# Patient Record
Sex: Female | Born: 1975 | Race: White | Hispanic: No | Marital: Married
Health system: Midwestern US, Academic
[De-identification: ages and names within clinical notes are randomized; demographics above are authoritative.]

---

## 2021-01-28 ENCOUNTER — Encounter: Admit: 2021-01-28 | Discharge: 2021-01-28

## 2021-01-28 DIAGNOSIS — M549 Dorsalgia, unspecified: Secondary | ICD-10-CM

## 2021-01-28 LAB — ALCOHOL LEVEL: ALCOHOL: <10 mg/dL

## 2021-01-28 LAB — COMPREHENSIVE METABOLIC PANEL
ALBUMIN: 3.1 g/dL — ABNORMAL LOW (ref 3.5–5.0)
ALK PHOSPHATASE: 104 U/L — ABNORMAL LOW (ref 25–110)
ALT: 15 U/L (ref 7–56)
AST: 14 U/L (ref 7–40)
BLD UREA NITROGEN: 8 mg/dL (ref 7–25)
CALCIUM: 7.9 mg/dL — ABNORMAL LOW (ref 8.5–10.6)
CHLORIDE: 100 MMOL/L — ABNORMAL LOW (ref 98–110)
CO2: 27 MMOL/L (ref 21–30)
CREATININE: 0.4 mg/dL (ref 0.4–1.00)
GLUCOSE,PANEL: 113 mg/dL — ABNORMAL HIGH (ref 70–100)
POTASSIUM: 3.3 MMOL/L — ABNORMAL LOW (ref 3.5–5.1)
SODIUM: 139 MMOL/L — ABNORMAL LOW (ref 137–147)
TOTAL BILIRUBIN: 0.3 mg/dL (ref 0.3–1.2)
TOTAL PROTEIN: 5.6 g/dL — ABNORMAL LOW (ref 6.0–8.0)

## 2021-01-28 LAB — CBC AND DIFF: WBC COUNT: 15 K/UL — ABNORMAL HIGH (ref 4.5–11.0)

## 2021-01-28 MED ORDER — OXYCODONE 5 MG PO TAB
5 mg | ORAL | 0 refills | Status: AC | PRN
Start: 2021-01-28 — End: ?
  Administered 2021-01-29 – 2021-02-01 (×11): 5 mg via ORAL

## 2021-01-28 MED ORDER — SENNOSIDES-DOCUSATE SODIUM 8.6-50 MG PO TAB
1 | Freq: Every day | ORAL | 0 refills | Status: AC | PRN
Start: 2021-01-28 — End: ?
  Administered 2021-01-30 – 2021-02-10 (×8): 1 via ORAL

## 2021-01-28 MED ORDER — FENTANYL CITRATE (PF) 50 MCG/ML IJ SOLN
25-50 ug | INTRAVENOUS | 0 refills | Status: AC | PRN
Start: 2021-01-28 — End: ?
  Administered 2021-01-29 – 2021-02-02 (×11): 50 ug via INTRAVENOUS

## 2021-01-28 MED ORDER — ACETAMINOPHEN 325 MG PO TAB
650 mg | ORAL | 0 refills | Status: AC | PRN
Start: 2021-01-28 — End: ?
  Administered 2021-01-31 – 2021-02-01 (×3): 650 mg via ORAL

## 2021-01-28 MED ORDER — POLYETHYLENE GLYCOL 3350 17 GRAM PO PWPK
1 | Freq: Every day | ORAL | 0 refills | Status: AC | PRN
Start: 2021-01-28 — End: ?

## 2021-01-28 MED ORDER — LORAZEPAM 1 MG PO TAB
2 mg | ORAL | 0 refills | Status: AC | PRN
Start: 2021-01-28 — End: ?

## 2021-01-28 MED ORDER — ONDANSETRON 4 MG PO TBDI
4 mg | ORAL | 0 refills | Status: AC | PRN
Start: 2021-01-28 — End: ?
  Administered 2021-02-10: 02:00:00 4 mg via ORAL

## 2021-01-28 MED ORDER — LORAZEPAM 1 MG PO TAB
1 mg | ORAL | 0 refills | Status: AC | PRN
Start: 2021-01-28 — End: ?

## 2021-01-28 MED ORDER — VANCOMYCIN (15-40KG) 250ML IVPB
15 mg/kg | INTRAVENOUS | 0 refills | Status: DC
Start: 2021-01-28 — End: 2021-01-29

## 2021-01-28 MED ORDER — VANCOMYCIN PHARMACY TO MANAGE
1 | 0 refills | Status: DC
Start: 2021-01-28 — End: 2021-01-29

## 2021-01-28 MED ORDER — ONDANSETRON HCL (PF) 4 MG/2 ML IJ SOLN
4 mg | INTRAVENOUS | 0 refills | Status: AC | PRN
Start: 2021-01-28 — End: ?
  Administered 2021-02-09 (×3): 4 mg via INTRAVENOUS

## 2021-01-28 MED ORDER — MELATONIN 5 MG PO TAB
10 mg | Freq: Every evening | ORAL | 0 refills | Status: AC
Start: 2021-01-28 — End: ?
  Administered 2021-01-29 – 2021-02-10 (×13): 10 mg via ORAL

## 2021-01-28 MED ORDER — LORAZEPAM 2 MG/ML IJ SOLN
1 mg | INTRAVENOUS | 0 refills | Status: AC | PRN
Start: 2021-01-28 — End: ?
  Administered 2021-01-29: 06:00:00 1 mg via INTRAVENOUS

## 2021-01-28 MED ORDER — MELATONIN 5 MG PO TAB
5 mg | Freq: Every evening | ORAL | 0 refills | Status: AC | PRN
Start: 2021-01-28 — End: ?

## 2021-01-28 MED ORDER — VANCOMYCIN 1250MG IN 0.9% NACL IVPB (BATCHED)
20 mg/kg | Freq: Once | INTRAVENOUS | 0 refills | Status: AC
Start: 2021-01-28 — End: ?
  Administered 2021-01-29: 06:00:00 1250 mg via INTRAVENOUS

## 2021-01-28 MED ORDER — VANCOMYCIN PHARMACY TO MANAGE
1 | 0 refills | Status: AC
Start: 2021-01-28 — End: ?

## 2021-01-28 MED ORDER — HEPARIN, PORCINE (PF) 5,000 UNIT/0.5 ML IJ SYRG
5000 [IU] | SUBCUTANEOUS | 0 refills | Status: AC
Start: 2021-01-28 — End: ?
  Administered 2021-01-29 – 2021-01-30 (×4): 5000 [IU] via SUBCUTANEOUS

## 2021-01-28 MED ORDER — LORAZEPAM 2 MG/ML IJ SOLN
2 mg | INTRAVENOUS | 0 refills | Status: AC | PRN
Start: 2021-01-28 — End: ?

## 2021-01-28 NOTE — Progress Notes
45 yo female pmhx of current smoker, hypothyroidism, back pain.  Pt has been seen recently working up lower back pain present for about 1 month. Had been given pain meds, muscle relaxers, ibuprofen   No trauma and no recent surgical intervention. No neurological deficits but left sided radiculopathy   On imaging Pt has L2-L3 discitis, osteomyelitis, with abscess calling it an epidural abscess    143/78  119  100% on RA  temp 100    WBC 14.8 with 71.9 neutrophils  Hgb 12.5  Hct 36.7  Plt 487  Lactate 2.0  K 3.2  Alk Phos 138    Blood cultures drawn x 2  Treatment:  IV pain meds Fentanyl and Dilaudid, IV fluids  Requesting recommendations on Abx

## 2021-01-29 ENCOUNTER — Inpatient Hospital Stay: Admit: 2021-01-29

## 2021-01-29 ENCOUNTER — Encounter: Admit: 2021-01-29 | Discharge: 2021-01-29 | Payer: BC Managed Care – PPO

## 2021-01-29 ENCOUNTER — Inpatient Hospital Stay: Admit: 2021-01-29 | Discharge: 2021-01-29 | Payer: BC Managed Care – PPO

## 2021-01-29 DIAGNOSIS — I10 Essential (primary) hypertension: Secondary | ICD-10-CM

## 2021-01-29 DIAGNOSIS — E039 Hypothyroidism, unspecified: Secondary | ICD-10-CM

## 2021-01-29 DIAGNOSIS — E785 Hyperlipidemia, unspecified: Secondary | ICD-10-CM

## 2021-01-29 MED ADMIN — POTASSIUM CHLORIDE 20 MEQ PO TBTQ [35943]: 60 meq | ORAL | @ 14:00:00 | Stop: 2021-01-29 | NDC 00245531989

## 2021-01-29 MED ADMIN — VANCOMYCIN 0.75G IN 0.9% NACL IVPB (BATCHED) [213736]: 750 mg | INTRAVENOUS | @ 17:00:00 | NDC 54029433209

## 2021-01-29 NOTE — Consults
Neurosurgery Consult History and Physical Examination    Kim Matthews  Admission Date:  01/28/2021                       Assessment/Plan:  Kim Matthews is a 45 y.o. 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    - No acute neurosurgical needs  - Agree with blood cultures x2 for bacterial isolation.  If negative, recommend repeat blood cultures.  - If blood cultures are negative, then recommend consulting IR for a x-ray guided needle biopsy of the area of concern at L1-2.  - The MRI performed does not demonstrate any surgical concerns, however it is a non-contrasted study.  For completeness in infectious workup, recommend an MRI L-spine with/without contrast.  Please re-engage neurosurgery when this scan is completed for review.  - Patient and plan discussed with neurosurgery attending and senior resident  - Please call with any changes in neurologic exam, questions, or concerns.    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  The patient provides the history.  She states that she has had lower back pain for the past month.  There is associated left leg pain with no radiculopathy, however there is localized left anterior thigh pain.  Her pain has been refractory to pain medications such as ibuprofen or muscle relaxants.  She denies any history of any recent trauma.  She denies any concerns for urinary or bowel incontinence.   At the OSH, an MRI L spine without contrast was done that demonstrated concerns for minimal L1-2 osteodiscitis with NO concerns for any epidural abscess or structural deformity.  There were signs of a psoas fluid collection potentially concerning for a psoas abscess.  The patient denies any history of drug use, however she is noted to have several pock marks throughout her extremities.  Blood cultures were drawn.  Temp 37.7.  WBC 14.8.     Past Medical History:  No pertinent past medical history    Past Surgical History:  No pertinent past surgical history    Social History:  Social History     Tobacco Use   ? Smoking status: Yes   ? Smokeless tobacco: Not on file   Substance Use Topics   ? Alcohol use: No   ? Drug use: Denies but highly suspicious       Family History:  No pertinent family medical history    Allergies:    Penicillins and Wellbutrin [bupropion hcl]    Medications:  No medications prior to admission.       Review of Systems:   Full 10 point review of systems negative except for HPI    Physical Exam:  Vital Signs: Last Filed In 24 Hours Vital Signs: 24 Hour Range   BP: 118/79 (05/06 2148)  Temp: 37.7 ?C (99.8 ?F) (05/06 2148)  Pulse: 126 (05/06 2148)  Respirations: 18 PER MINUTE (05/06 2148)  SpO2: 94 % (05/06 2148)  O2 Device: None (Room air) (05/06 2148) BP: (118)/(79)   Temp:  [37.7 ?C (99.8 ?F)]   Pulse:  [78-126]   Respirations:  [18 PER MINUTE]   SpO2:  [94 %-98 %]  O2 Device: None (Room air)   Intensity Pain Scale (Self Report): 4 (01/28/21 2100)      General appearance: No acute distress  Lungs: Clear to auscultation bilaterally  Heart: Regular rate and rhythm  Gastrointestinal: Soft, non-tender. Bowel sounds normal. no masses,  no organomegaly  Musculoskeletal: No edema, redness or tenderness in the calves or thighs  Skin: Pock marks throughout all extremities  Psychiatric: Normal affect    Neurologic Exam:   Mental Status: Awake, alert and oriented x 4, fluent speech, normal cognition  Pupils: Pupils equal round and reactive to light  Cranial Nerves: CN II-XII individually tested and found to be intact, gag not tested  Motor:  She has good range of motion and has some limited movements to resistance in the left hip flexion secondary to pain, otherwise she is full strength throughout.  Normal muscle bulk and tone  Sensation: Sensation intact to light touch throughout  Deep Tendon Reflexes:   Tr Bi Br Pa Ac   Left 2 2 2 2 2    Right 2 2 2 2 2    Plantar responses toes downgoing bilaterally  No clonus bilaterally  Rectal: Not assessed    Lab Tests:  Hematology:  No results found for: HGB, HCT, PLTCT, WBC, NEUT, ANC, LYMPH, ALC, ABSLYMPHCT, MONA, AMC, ABC, BASOPHILS, MCV, MCHC, MPV, RDW  General Chemistry: No results found for: NA, K, CL, CO2, BUN, CR, GLU, OBSCA, CA, MG, PO4, FREEPHENY  General Chemistry: No results found for: GAP, KETONES, ALBUMIN, LACTIC, TOTBILI, DBILI, BILIIND, TOTBILCB, TOTPROT, LIPASE, AMY, AST, ALT, ALKPHOS, LDH    Radiology and other Diagnostics Review:    Pertinent imaging reviewed   MRI L spine without contrast demonstrates concerns for minimal L1-2 osteodiscitis with NO concerns for any epidural abscess or loss of height.  There were signs of a psoas fluid collection potentially concerning for a psoas abscess.    Marlynn Perking, MD  [phone]

## 2021-01-30 ENCOUNTER — Encounter: Admit: 2021-01-30 | Discharge: 2021-01-30 | Payer: BC Managed Care – PPO

## 2021-01-30 ENCOUNTER — Inpatient Hospital Stay: Admit: 2021-01-30 | Discharge: 2021-01-30 | Payer: BC Managed Care – PPO

## 2021-01-30 MED ADMIN — CEFTRIAXONE INJ 2GM IVP [210254]: 2 g | INTRAVENOUS | @ 01:00:00 | NDC 00781320990

## 2021-01-30 MED ADMIN — HEPARIN, PORCINE (PF) 5,000 UNIT/0.5 ML IJ SYRG [95535]: 5000 [IU] | SUBCUTANEOUS | @ 19:00:00 | Stop: 2021-01-30 | NDC 00409131611

## 2021-01-30 MED ADMIN — VANCOMYCIN 0.75G IN 0.9% NACL IVPB (BATCHED) [213736]: 750 mg | INTRAVENOUS | @ 18:00:00 | NDC 54029433209

## 2021-01-30 MED ADMIN — VANCOMYCIN 0.75G IN 0.9% NACL IVPB (BATCHED) [213736]: 750 mg | INTRAVENOUS | @ 06:00:00 | NDC 54029433209

## 2021-01-31 ENCOUNTER — Encounter: Admit: 2021-01-31 | Discharge: 2021-01-31 | Payer: BC Managed Care – PPO

## 2021-01-31 ENCOUNTER — Inpatient Hospital Stay: Admit: 2021-01-31 | Discharge: 2021-01-31 | Payer: BC Managed Care – PPO

## 2021-01-31 DIAGNOSIS — E785 Hyperlipidemia, unspecified: Secondary | ICD-10-CM

## 2021-01-31 DIAGNOSIS — E039 Hypothyroidism, unspecified: Secondary | ICD-10-CM

## 2021-01-31 DIAGNOSIS — I10 Essential (primary) hypertension: Secondary | ICD-10-CM

## 2021-01-31 MED ADMIN — MIDAZOLAM 1 MG/ML IJ SOLN [10607]: 1 mg | INTRAVENOUS | @ 18:00:00 | Stop: 2021-01-31 | NDC 00409230516

## 2021-01-31 MED ADMIN — FENTANYL CITRATE (PF) 50 MCG/ML IJ SOLN [3037]: 25 ug | INTRAVENOUS | @ 18:00:00 | Stop: 2021-01-31 | NDC 00409909425

## 2021-01-31 MED ADMIN — SODIUM CHLORIDE 0.9 % IJ SOLN [7319]: 10 mL | INTRAVENOUS | @ 07:00:00 | Stop: 2021-01-31 | NDC 00409488820

## 2021-01-31 MED ADMIN — GADOBENATE DIMEGLUMINE 529 MG/ML (0.1MMOL/0.2ML) IV SOLN [135881]: 13 mL | INTRAVENOUS | @ 07:00:00 | Stop: 2021-01-31 | NDC 00270516414

## 2021-01-31 MED ADMIN — FENTANYL CITRATE (PF) 50 MCG/ML IJ SOLN [3037]: 50 ug | INTRAVENOUS | @ 18:00:00 | Stop: 2021-01-31 | NDC 00409909425

## 2021-01-31 MED ADMIN — CEFTRIAXONE INJ 2GM IVP [210254]: 2 g | INTRAVENOUS | @ 02:00:00 | NDC 00781320990

## 2021-01-31 MED ADMIN — VANCOMYCIN 0.75G IN 0.9% NACL IVPB (BATCHED) [213736]: 750 mg | INTRAVENOUS | @ 21:00:00 | NDC 54029433209

## 2021-01-31 MED ADMIN — VANCOMYCIN 0.75G IN 0.9% NACL IVPB (BATCHED) [213736]: 750 mg | INTRAVENOUS | @ 05:00:00 | NDC 54029433209

## 2021-02-01 ENCOUNTER — Inpatient Hospital Stay: Admit: 2021-02-01 | Discharge: 2021-02-01 | Payer: BC Managed Care – PPO

## 2021-02-01 MED ADMIN — OXYCODONE 5 MG PO TAB [10814]: 10 mg | ORAL | @ 23:00:00 | NDC 00904696661

## 2021-02-01 MED ADMIN — POLYETHYLENE GLYCOL 3350 17 GRAM PO PWPK [25424]: 17 g | ORAL | @ 17:00:00 | NDC 00904693186

## 2021-02-01 MED ADMIN — VANCOMYCIN 0.75G IN 0.9% NACL IVPB (BATCHED) [213736]: 750 mg | INTRAVENOUS | @ 09:00:00 | Stop: 2021-02-01 | NDC 54029433209

## 2021-02-01 MED ADMIN — VANCOMYCIN 0.75G IN 0.9% NACL IVPB (BATCHED) [213736]: 750 mg | INTRAVENOUS | @ 20:00:00 | Stop: 2021-02-01 | NDC 54029433209

## 2021-02-01 MED ADMIN — CEFTRIAXONE INJ 2GM IVP [210254]: 2 g | INTRAVENOUS | @ 01:00:00 | NDC 00781320990

## 2021-02-01 MED ADMIN — OXYCODONE 5 MG PO TAB [10814]: 10 mg | ORAL | @ 15:00:00 | NDC 00406055223

## 2021-02-01 MED ADMIN — WATER FOR INJECTION, STERILE IJ SOLN [79513]: 20 mL | INTRAVENOUS | @ 01:00:00 | Stop: 2021-02-01 | NDC 00409488723

## 2021-02-01 MED ADMIN — OXYCODONE 5 MG PO TAB [10814]: 10 mg | ORAL | @ 20:00:00 | NDC 00406055223

## 2021-02-02 ENCOUNTER — Encounter: Admit: 2021-02-02 | Discharge: 2021-02-02 | Payer: BC Managed Care – PPO

## 2021-02-02 ENCOUNTER — Inpatient Hospital Stay: Admit: 2021-02-02 | Discharge: 2021-02-02 | Payer: BC Managed Care – PPO

## 2021-02-02 MED ADMIN — VANCOMYCIN 1,000 MG IV SOLR [8442]: 1000 mg | INTRAVENOUS | @ 09:00:00 | NDC 72611076501

## 2021-02-02 MED ADMIN — CEFTRIAXONE INJ 2GM IVP [210254]: 2 g | INTRAVENOUS | @ 03:00:00 | NDC 00781320990

## 2021-02-02 MED ADMIN — OXYCODONE 5 MG PO TAB [10814]: 10 mg | ORAL | @ 23:00:00 | NDC 00406055223

## 2021-02-02 MED ADMIN — DEXTROSE 5% IN WATER IV SOLP [2364]: 1000 mg | INTRAVENOUS | @ 09:00:00 | NDC 00338001702

## 2021-02-02 MED ADMIN — OXYCODONE 10 MG PO TAB [166908]: 10 mg | ORAL | @ 09:00:00 | NDC 68084096811

## 2021-02-02 MED ADMIN — OXYCODONE 10 MG PO TAB [166908]: 10 mg | ORAL | @ 12:00:00 | NDC 68084096811

## 2021-02-02 MED ADMIN — OXYCODONE 5 MG PO TAB [10814]: 10 mg | ORAL | @ 18:00:00 | NDC 00904696661

## 2021-02-02 MED ADMIN — POLYETHYLENE GLYCOL 3350 17 GRAM PO PWPK [25424]: 17 g | ORAL | @ 15:00:00 | NDC 00904693186

## 2021-02-02 MED ADMIN — VANCOMYCIN 1,000 MG IV SOLR [8442]: 1000 mg | INTRAVENOUS | @ 18:00:00 | NDC 72611076501

## 2021-02-02 MED ADMIN — OXYCODONE 10 MG PO TAB [166908]: 10 mg | ORAL | @ 06:00:00 | NDC 68084096811

## 2021-02-02 MED ADMIN — OXYCODONE 5 MG PO TAB [10814]: 10 mg | ORAL | @ 03:00:00 | NDC 00406055223

## 2021-02-02 MED ADMIN — OXYCODONE 5 MG PO TAB [10814]: 10 mg | ORAL | @ 15:00:00 | NDC 00904696661

## 2021-02-02 MED ADMIN — DEXTROSE 5% IN WATER IV SOLP [2364]: 1000 mg | INTRAVENOUS | @ 03:00:00 | NDC 00338001702

## 2021-02-02 MED ADMIN — VANCOMYCIN 1,000 MG IV SOLR [8442]: 1000 mg | INTRAVENOUS | @ 03:00:00 | NDC 72611076501

## 2021-02-02 MED ADMIN — DEXTROSE 5% IN WATER IV SOLP [2364]: 1000 mg | INTRAVENOUS | @ 18:00:00 | NDC 00338001702

## 2021-02-03 ENCOUNTER — Encounter: Admit: 2021-02-03 | Discharge: 2021-02-03 | Payer: BC Managed Care – PPO

## 2021-02-03 ENCOUNTER — Inpatient Hospital Stay: Admit: 2021-02-03 | Discharge: 2021-02-03 | Payer: BC Managed Care – PPO

## 2021-02-03 MED ADMIN — OXYCODONE 10 MG PO TAB [166908]: 10 mg | ORAL | @ 08:00:00 | NDC 68084096811

## 2021-02-03 MED ADMIN — OXYCODONE 10 MG PO TAB [166908]: 10 mg | ORAL | @ 05:00:00 | NDC 68084096811

## 2021-02-03 MED ADMIN — CEFTRIAXONE INJ 2GM IVP [210254]: 2 g | INTRAVENOUS | @ 02:00:00 | NDC 00781320990

## 2021-02-03 MED ADMIN — VANCOMYCIN 1,000 MG IV SOLR [8442]: 1000 mg | INTRAVENOUS | @ 10:00:00 | NDC 72611076501

## 2021-02-03 MED ADMIN — VANCOMYCIN 1,000 MG IV SOLR [8442]: 1000 mg | INTRAVENOUS | @ 02:00:00 | NDC 72611076501

## 2021-02-03 MED ADMIN — DEXTROSE 5% IN WATER IV SOLP [2364]: 1000 mg | INTRAVENOUS | @ 10:00:00 | NDC 00338001702

## 2021-02-03 MED ADMIN — MIDAZOLAM 1 MG/ML IJ SOLN [10607]: 1 mg | INTRAVENOUS | @ 18:00:00 | Stop: 2021-02-03 | NDC 00409230516

## 2021-02-03 MED ADMIN — DEXTROSE 5% IN WATER IV SOLP [2364]: 1000 mg | INTRAVENOUS | @ 22:00:00 | NDC 00338001702

## 2021-02-03 MED ADMIN — GABAPENTIN 100 MG PO CAP [18309]: 100 mg | ORAL | @ 02:00:00 | NDC 63739090210

## 2021-02-03 MED ADMIN — OXYCODONE 10 MG PO TAB [166908]: 10 mg | ORAL | @ 02:00:00 | NDC 68084096811

## 2021-02-03 MED ADMIN — OXYCODONE 5 MG PO TAB [10814]: 10 mg | ORAL | @ 22:00:00 | NDC 00406055223

## 2021-02-03 MED ADMIN — MIDAZOLAM 1 MG/ML IJ SOLN [10607]: 1 mg | INTRAVENOUS | @ 19:00:00 | Stop: 2021-02-03 | NDC 00409230516

## 2021-02-03 MED ADMIN — FENTANYL CITRATE (PF) 50 MCG/ML IJ SOLN [3037]: 50 ug | INTRAVENOUS | @ 19:00:00 | Stop: 2021-02-03 | NDC 00409909425

## 2021-02-03 MED ADMIN — FENTANYL CITRATE (PF) 50 MCG/ML IJ SOLN [3037]: 50 ug | INTRAVENOUS | @ 17:00:00 | NDC 00409909412

## 2021-02-03 MED ADMIN — POLYETHYLENE GLYCOL 3350 17 GRAM PO PWPK [25424]: 17 g | ORAL | @ 14:00:00 | Stop: 2021-02-03 | NDC 00904693186

## 2021-02-03 MED ADMIN — GABAPENTIN 100 MG PO CAP [18309]: 100 mg | ORAL | @ 14:00:00 | NDC 63739090210

## 2021-02-03 MED ADMIN — ACETAMINOPHEN 500 MG PO TAB [102]: 1000 mg | ORAL | @ 05:00:00 | NDC 00904673061

## 2021-02-03 MED ADMIN — PERFLUTREN LIPID MICROSPHERES 1.1 MG/ML IV SUSP [79178]: 7.5 mL | INTRAVENOUS | @ 22:00:00 | Stop: 2021-02-03 | NDC 11994001116

## 2021-02-03 MED ADMIN — OXYCODONE 10 MG PO TAB [166908]: 10 mg | ORAL | @ 10:00:00 | NDC 68084096811

## 2021-02-03 MED ADMIN — DEXTROSE 5% IN WATER IV SOLP [2364]: 1000 mg | INTRAVENOUS | @ 02:00:00 | NDC 00338001702

## 2021-02-03 MED ADMIN — FENTANYL CITRATE (PF) 50 MCG/ML IJ SOLN [3037]: 50 ug | INTRAVENOUS | @ 04:00:00 | NDC 00409909412

## 2021-02-03 MED ADMIN — ACETAMINOPHEN 500 MG PO TAB [102]: 1000 mg | ORAL | @ 10:00:00 | NDC 00904673061

## 2021-02-03 MED ADMIN — VANCOMYCIN 1,000 MG IV SOLR [8442]: 1000 mg | INTRAVENOUS | @ 22:00:00 | NDC 72611076501

## 2021-02-03 MED ADMIN — OXYCODONE 5 MG PO TAB [10814]: 10 mg | ORAL | @ 14:00:00 | NDC 00406055223

## 2021-02-04 MED ADMIN — DEXTROSE 5% IN WATER IV SOLP [2364]: 1000 mg | INTRAVENOUS | @ 21:00:00 | NDC 00338001702

## 2021-02-04 MED ADMIN — GABAPENTIN 100 MG PO CAP [18309]: 100 mg | ORAL | @ 14:00:00 | NDC 60687058011

## 2021-02-04 MED ADMIN — DEXTROSE 5% IN WATER IV SOLP [2364]: 1000 mg | INTRAVENOUS | @ 05:00:00 | NDC 00338001702

## 2021-02-04 MED ADMIN — GABAPENTIN 100 MG PO CAP [18309]: 100 mg | ORAL | @ 01:00:00 | NDC 63739090210

## 2021-02-04 MED ADMIN — VANCOMYCIN 1,000 MG IV SOLR [8442]: 1000 mg | INTRAVENOUS | @ 21:00:00 | NDC 72611076501

## 2021-02-04 MED ADMIN — CEFTRIAXONE INJ 2GM IVP [210254]: 2 g | INTRAVENOUS | @ 02:00:00 | NDC 00781320990

## 2021-02-04 MED ADMIN — OXYCODONE 5 MG PO TAB [10814]: 10 mg | ORAL | @ 06:00:00 | NDC 00406055223

## 2021-02-04 MED ADMIN — VANCOMYCIN 1,000 MG IV SOLR [8442]: 1000 mg | INTRAVENOUS | @ 05:00:00 | NDC 72611076501

## 2021-02-04 MED ADMIN — POLYETHYLENE GLYCOL 3350 17 GRAM PO PWPK [25424]: 34 g | ORAL | @ 21:00:00 | NDC 00904693186

## 2021-02-04 MED ADMIN — ACETAMINOPHEN 500 MG PO TAB [102]: 1000 mg | ORAL | @ 19:00:00 | NDC 00904673061

## 2021-02-04 MED ADMIN — ACETAMINOPHEN 500 MG PO TAB [102]: 1000 mg | ORAL | @ 02:00:00 | NDC 00904673061

## 2021-02-04 MED ADMIN — ACETAMINOPHEN 500 MG PO TAB [102]: 1000 mg | ORAL | @ 12:00:00 | NDC 00904673061

## 2021-02-04 MED ADMIN — SODIUM CHLORIDE 0.9 % IV SOLP [27838]: 250 mL | INTRAVENOUS | @ 14:00:00 | Stop: 2021-02-04 | NDC 00338004902

## 2021-02-04 MED ADMIN — ENOXAPARIN 40 MG/0.4 ML SC SYRG [85052]: 40 mg | SUBCUTANEOUS | @ 01:00:00 | NDC 00781324602

## 2021-02-04 MED ADMIN — OXYCODONE 5 MG PO TAB [10814]: 10 mg | ORAL | @ 01:00:00 | NDC 00406055223

## 2021-02-04 MED ADMIN — OXYCODONE 5 MG PO TAB [10814]: 10 mg | ORAL | @ 19:00:00 | NDC 00406055223

## 2021-02-04 MED ADMIN — DEXTROSE 5% IN WATER IV SOLP [2364]: 1000 mg | INTRAVENOUS | @ 14:00:00 | NDC 00338001702

## 2021-02-04 MED ADMIN — VANCOMYCIN 1,000 MG IV SOLR [8442]: 1000 mg | INTRAVENOUS | @ 14:00:00 | NDC 72611076501

## 2021-02-04 MED ADMIN — LACTULOSE 10 GRAM/15 ML PO LIQUID GROUP [280001]: 20 g | ORAL | @ 19:00:00 | NDC 00121457715

## 2021-02-04 MED ADMIN — OXYCODONE 5 MG PO TAB [10814]: 10 mg | ORAL | @ 12:00:00 | NDC 00406055223

## 2021-02-04 MED ADMIN — BISACODYL 10 MG RE SUPP [1080]: 10 mg | RECTAL | Stop: 2021-02-04 | NDC 00574705012

## 2021-02-05 MED ADMIN — VANCOMYCIN 1,000 MG IV SOLR [8442]: 1000 mg | INTRAVENOUS | @ 06:00:00 | Stop: 2021-02-05 | NDC 72611076501

## 2021-02-05 MED ADMIN — CEFTRIAXONE INJ 2GM IVP [210254]: 2 g | INTRAVENOUS | @ 03:00:00 | NDC 00781320990

## 2021-02-05 MED ADMIN — ACETAMINOPHEN 500 MG PO TAB [102]: 1000 mg | ORAL | @ 11:00:00 | NDC 00904673061

## 2021-02-05 MED ADMIN — ENOXAPARIN 40 MG/0.4 ML SC SYRG [85052]: 40 mg | SUBCUTANEOUS | @ 03:00:00 | NDC 00781324602

## 2021-02-05 MED ADMIN — GABAPENTIN 100 MG PO CAP [18309]: 100 mg | ORAL | @ 13:00:00 | NDC 63739090210

## 2021-02-05 MED ADMIN — DEXTROSE 5% IN WATER IV SOLP [2364]: 1000 mg | INTRAVENOUS | @ 13:00:00 | Stop: 2021-02-05 | NDC 00338001702

## 2021-02-05 MED ADMIN — ACETAMINOPHEN 500 MG PO TAB [102]: 1000 mg | ORAL | @ 18:00:00 | NDC 00904673061

## 2021-02-05 MED ADMIN — OXYCODONE 5 MG PO TAB [10814]: 10 mg | ORAL | @ 12:00:00 | NDC 00406055223

## 2021-02-05 MED ADMIN — DEXTROSE 5% IN WATER IV SOLP [2364]: 1000 mg | INTRAVENOUS | @ 06:00:00 | Stop: 2021-02-05 | NDC 00338001702

## 2021-02-05 MED ADMIN — VANCOMYCIN 1,000 MG IV SOLR [8442]: 1000 mg | INTRAVENOUS | @ 13:00:00 | Stop: 2021-02-05 | NDC 72611076501

## 2021-02-05 MED ADMIN — WATER FOR INJECTION, STERILE IJ SOLN [79513]: 20 mL | INTRAVENOUS | @ 18:00:00 | Stop: 2021-02-05 | NDC 00409488723

## 2021-02-05 MED ADMIN — OXYCODONE 5 MG PO TAB [10814]: 10 mg | ORAL | @ 03:00:00 | NDC 00406055223

## 2021-02-05 MED ADMIN — ACETAMINOPHEN 500 MG PO TAB [102]: 1000 mg | ORAL | @ 03:00:00 | NDC 00904673061

## 2021-02-05 MED ADMIN — POLYETHYLENE GLYCOL 3350 17 GRAM PO PWPK [25424]: 34 g | ORAL | @ 03:00:00 | NDC 00904693186

## 2021-02-05 MED ADMIN — CEFAZOLIN INJ 1GM IVP [210319]: 2 g | INTRAVENOUS | @ 18:00:00 | NDC 60505614200

## 2021-02-05 MED ADMIN — GABAPENTIN 100 MG PO CAP [18309]: 100 mg | ORAL | @ 03:00:00 | NDC 60687058011

## 2021-02-06 MED ADMIN — METHOCARBAMOL 750 MG PO TAB [4972]: 750 mg | ORAL | @ 17:00:00 | NDC 00904705861

## 2021-02-06 MED ADMIN — WATER FOR INJECTION, STERILE IJ SOLN [79513]: 20 mL | INTRAVENOUS | @ 17:00:00 | Stop: 2021-02-06 | NDC 00409488723

## 2021-02-06 MED ADMIN — ACETAMINOPHEN 500 MG PO TAB [102]: 1000 mg | ORAL | @ 12:00:00 | NDC 00904673061

## 2021-02-06 MED ADMIN — ACETAMINOPHEN 500 MG PO TAB [102]: 1000 mg | ORAL | @ 21:00:00 | NDC 00904673061

## 2021-02-06 MED ADMIN — OXYCODONE 5 MG PO TAB [10814]: 5 mg | ORAL | @ 05:00:00 | NDC 00406055223

## 2021-02-06 MED ADMIN — OXYCODONE 5 MG PO TAB [10814]: 10 mg | ORAL | @ 12:00:00 | NDC 00406055223

## 2021-02-06 MED ADMIN — ENOXAPARIN 40 MG/0.4 ML SC SYRG [85052]: 40 mg | SUBCUTANEOUS | @ 01:00:00 | NDC 00781324602

## 2021-02-06 MED ADMIN — OXYCODONE 5 MG PO TAB [10814]: 10 mg | ORAL | @ 21:00:00 | NDC 00406055223

## 2021-02-06 MED ADMIN — GABAPENTIN 100 MG PO CAP [18309]: 100 mg | ORAL | @ 13:00:00 | NDC 60687058011

## 2021-02-06 MED ADMIN — OXYCODONE 5 MG PO TAB [10814]: 5 mg | ORAL | @ 17:00:00 | NDC 00406055223

## 2021-02-06 MED ADMIN — CEFAZOLIN INJ 1GM IVP [210319]: 2 g | INTRAVENOUS | @ 01:00:00 | NDC 60505614200

## 2021-02-06 MED ADMIN — GABAPENTIN 100 MG PO CAP [18309]: 100 mg | ORAL | @ 01:00:00 | NDC 60687058011

## 2021-02-06 MED ADMIN — OXYCODONE 5 MG PO TAB [10814]: 10 mg | ORAL | @ 01:00:00 | NDC 00406055223

## 2021-02-06 MED ADMIN — OXYCODONE 5 MG PO TAB [10814]: 10 mg | ORAL | @ 09:00:00 | NDC 00406055223

## 2021-02-06 MED ADMIN — CEFAZOLIN INJ 1GM IVP [210319]: 2 g | INTRAVENOUS | @ 17:00:00 | NDC 60505614200

## 2021-02-06 MED ADMIN — ACETAMINOPHEN 500 MG PO TAB [102]: 1000 mg | ORAL | @ 05:00:00 | NDC 00904673061

## 2021-02-06 MED ADMIN — CEFAZOLIN INJ 1GM IVP [210319]: 2 g | INTRAVENOUS | @ 09:00:00 | NDC 60505614200

## 2021-02-07 ENCOUNTER — Encounter: Admit: 2021-02-07 | Discharge: 2021-02-07 | Payer: BC Managed Care – PPO

## 2021-02-07 ENCOUNTER — Inpatient Hospital Stay: Admit: 2021-02-07 | Discharge: 2021-02-07 | Payer: BC Managed Care – PPO

## 2021-02-07 MED ADMIN — ACETAMINOPHEN 500 MG PO TAB [102]: 1000 mg | ORAL | @ 02:00:00 | NDC 00904673061

## 2021-02-07 MED ADMIN — CEFAZOLIN INJ 1GM IVP [210319]: 2 g | INTRAVENOUS | @ 17:00:00 | NDC 60505614200

## 2021-02-07 MED ADMIN — CEFAZOLIN INJ 1GM IVP [210319]: 2 g | INTRAVENOUS | @ 10:00:00 | NDC 60505614200

## 2021-02-07 MED ADMIN — METHOCARBAMOL 750 MG PO TAB [4972]: 750 mg | ORAL | @ 14:00:00 | NDC 00904705861

## 2021-02-07 MED ADMIN — ENOXAPARIN 40 MG/0.4 ML SC SYRG [85052]: 40 mg | SUBCUTANEOUS | @ 01:00:00 | NDC 00781324602

## 2021-02-07 MED ADMIN — OXYCODONE 5 MG PO TAB [10814]: 10 mg | ORAL | @ 18:00:00 | NDC 00406055223

## 2021-02-07 MED ADMIN — CEFAZOLIN INJ 1GM IVP [210319]: 2 g | INTRAVENOUS | @ 01:00:00 | NDC 60505614200

## 2021-02-07 MED ADMIN — OXYCODONE 5 MG PO TAB [10814]: 5 mg | ORAL | @ 14:00:00 | NDC 00406055223

## 2021-02-07 MED ADMIN — GABAPENTIN 100 MG PO CAP [18309]: 100 mg | ORAL | @ 01:00:00 | NDC 60687058011

## 2021-02-07 MED ADMIN — OXYCODONE 5 MG PO TAB [10814]: 10 mg | ORAL | @ 08:00:00 | NDC 00406055223

## 2021-02-07 MED ADMIN — OXYCODONE 5 MG PO TAB [10814]: 10 mg | ORAL | @ 21:00:00 | NDC 00406055223

## 2021-02-07 MED ADMIN — ACETAMINOPHEN 500 MG PO TAB [102]: 1000 mg | ORAL | @ 20:00:00 | NDC 00904673061

## 2021-02-07 MED ADMIN — GABAPENTIN 100 MG PO CAP [18309]: 100 mg | ORAL | @ 14:00:00 | NDC 60687058011

## 2021-02-07 MED ADMIN — ACETAMINOPHEN 500 MG PO TAB [102]: 1000 mg | ORAL | @ 10:00:00 | NDC 00904673061

## 2021-02-08 ENCOUNTER — Inpatient Hospital Stay: Admit: 2021-02-08 | Discharge: 2021-02-08 | Payer: BC Managed Care – PPO

## 2021-02-08 ENCOUNTER — Encounter: Admit: 2021-02-08 | Discharge: 2021-02-08 | Payer: BC Managed Care – PPO

## 2021-02-08 MED ORDER — PROPOFOL 10 MG/ML IV EMUL 20 ML (INFUSION)(AM)(OR)
INTRAVENOUS | 0 refills | Status: DC
Start: 2021-02-08 — End: 2021-02-08
  Administered 2021-02-08: 18:00:00 150 ug/kg/min via INTRAVENOUS

## 2021-02-08 MED ORDER — PROPOFOL INJ 10 MG/ML IV VIAL
INTRAVENOUS | 0 refills | Status: DC
Start: 2021-02-08 — End: 2021-02-08
  Administered 2021-02-08: 18:00:00 40 mg via INTRAVENOUS
  Administered 2021-02-08: 18:00:00 80 mg via INTRAVENOUS

## 2021-02-08 MED ORDER — LIDOCAINE (PF) 20 MG/ML (2 %) IJ SOLN
INTRAVENOUS | 0 refills | Status: DC
Start: 2021-02-08 — End: 2021-02-08
  Administered 2021-02-08: 18:00:00 80 mg via INTRAVENOUS

## 2021-02-08 MED ORDER — SODIUM CHLORIDE 0.9 % IV SOLP (OR) 500ML
INTRAVENOUS | 0 refills | Status: DC
Start: 2021-02-08 — End: 2021-02-08
  Administered 2021-02-08: 18:00:00 via INTRAVENOUS

## 2021-02-08 MED ADMIN — OXYCODONE 5 MG PO TAB [10814]: 5 mg | ORAL | @ 02:00:00 | NDC 00406055223

## 2021-02-08 MED ADMIN — CEFAZOLIN INJ 1GM IVP [210319]: 2 g | INTRAVENOUS | @ 09:00:00 | NDC 60505614200

## 2021-02-08 MED ADMIN — OXYCODONE 5 MG PO TAB [10814]: 5 mg | ORAL | @ 07:00:00 | NDC 00406055223

## 2021-02-08 MED ADMIN — WATER FOR INJECTION, STERILE IJ SOLN [79513]: 20 mL | INTRAVENOUS | @ 09:00:00 | Stop: 2021-02-08 | NDC 00409488723

## 2021-02-08 MED ADMIN — ENOXAPARIN 40 MG/0.4 ML SC SYRG [85052]: 40 mg | SUBCUTANEOUS | @ 02:00:00 | NDC 00781324602

## 2021-02-08 MED ADMIN — GABAPENTIN 100 MG PO CAP [18309]: 100 mg | ORAL | @ 13:00:00 | NDC 63739090210

## 2021-02-08 MED ADMIN — CEFAZOLIN INJ 1GM IVP [210319]: 2 g | INTRAVENOUS | @ 02:00:00 | NDC 60505614200

## 2021-02-08 MED ADMIN — CEFAZOLIN INJ 1GM IVP [210319]: 2 g | INTRAVENOUS | @ 19:00:00 | NDC 60505614200

## 2021-02-08 MED ADMIN — OXYCODONE 5 MG PO TAB [10814]: 10 mg | ORAL | @ 16:00:00 | NDC 00406055223

## 2021-02-08 MED ADMIN — ACETAMINOPHEN 500 MG PO TAB [102]: 1000 mg | ORAL | @ 19:00:00 | NDC 00904673061

## 2021-02-08 MED ADMIN — ACETAMINOPHEN 500 MG PO TAB [102]: 1000 mg | ORAL | @ 02:00:00 | NDC 00904673061

## 2021-02-08 MED ADMIN — OXYCODONE 5 MG PO TAB [10814]: 10 mg | ORAL | NDC 00406055223

## 2021-02-08 MED ADMIN — GABAPENTIN 100 MG PO CAP [18309]: 100 mg | ORAL | @ 02:00:00 | NDC 60687058011

## 2021-02-08 MED ADMIN — ACETAMINOPHEN 500 MG PO TAB [102]: 1000 mg | ORAL | @ 10:00:00 | NDC 00904673061

## 2021-02-08 MED ADMIN — OXYCODONE 5 MG PO TAB [10814]: 10 mg | ORAL | @ 19:00:00 | NDC 00406055223

## 2021-02-08 NOTE — Anesthesia Pre-Procedure Evaluation
Anesthesia Pre-Procedure Evaluation    Name: Kim Matthews      MRN: [id number]     DOB: 1976-08-18     Age: 45 y.o.     Sex: female   _________________________________________________________________________     Procedure Info:   Procedure Information     Date/Time: 02/08/21 1200    Scheduled providers: Charlene Brooke, Herbert Seta, DO    Procedure: TRANSESOPHAGEAL ECHO    Location: Cardiology:[facility]          Physical Assessment  Vital Signs (last filed in past 24 hours):  BP: 109/76 (05/17 0518)  Temp: 36.8 ?C (98.2 ?F) (05/17 0518)  Pulse: 106 (05/17 0518)  Respirations: 16 PER MINUTE (05/17 0518)  SpO2: 97 % (05/17 0518)  O2 Device: None (Room air) (05/17 0518)      Patient History   Allergies   Allergen Reactions   ? Banana SWOLLEN TONGUE     Patient actually endorsed a swollen throat reaction.   ? Tree Nuts SWOLLEN TONGUE     Patient actually endorsed a swollen throat reaction.   ? Watermelon SWOLLEN TONGUE     Patient actually endorsed a swollen throat reaction.   ? Penicillins HIVES   ? Wellbutrin [Bupropion Hcl] HIVES        Current Medications    Medication Directions   albuterol sulfate (PROAIR HFA) 90 mcg/actuation HFA aerosol inhaler Inhale 2 puffs by mouth into the lungs every 6 hours as needed for Wheezing or Shortness of Breath. Shake well before use.   aspirin/acetaminophen/caffeine (EXCEDRIN MIGRAINE) 250/250/65 mg tablet Take 2 tablets by mouth every 6 hours as needed.   cyclobenzaprine (FLEXERIL) 10 mg tablet Take 10 mg by mouth every 8 hours.   ibuprofen (ADVIL) 200 mg tablet Take 800 mg by mouth every 8 hours as needed for Pain. Take with food.   lisinopriL (ZESTRIL) 20 mg tablet Take 20 mg by mouth daily.   loratadine (CLARITIN) 10 mg tablet Take 10 mg by mouth twice daily.   menthol (BIOFREEZE (MENTHOL)) 4 % gel Apply  topically to affected area daily as needed.   multivitamin-folic acid-biotin (HAIR-SKIN-NAILS (MV-FA-BIOTIN)) 400-2,000 mcg tab Take 1 tablet by mouth twice daily. potassium chloride SR (K-DUR) 20 mEq tablet Take 1 tablet by mouth daily. Take with a meal and a full glass of water.         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, L>R      Endocrine/Other  Hypothyroidism   Physical Exam    Airway Findings      Mallampati: II      TM distance: >3 FB      Neck ROM: full      Mouth opening: good      Airway patency: adequate    Dental Findings:       Upper dentures and lower dentures    Cardiovascular Findings: Negative      Pulmonary Findings: Negative      Abdominal Findings: Negative      Neurological Findings: Negative         Diagnostic Tests  Hematology:   Lab Results   Component Value Date    HGB 11.0 02/08/2021    HCT 31.9 02/08/2021    PLTCT 745 02/08/2021    WBC 10.0 02/08/2021    NEUT 62 02/08/2021    ANC 6.24 02/08/2021    ALC 3.08 02/08/2021    MONA 6 02/08/2021    AMC 0.64 02/08/2021    EOSA 1 02/08/2021    ABC 0.10 02/08/2021    MCV 85.3 02/08/2021    MCH 29.3 02/08/2021    MCHC 34.3 02/08/2021    MPV 7.8 02/08/2021    RDW 13.4 02/08/2021         General Chemistry:   Lab Results   Component Value Date    NA 139 02/08/2021    K 3.9 02/08/2021    CL 98 02/08/2021    CO2 31 02/08/2021    GAP 10 02/08/2021    BUN 10 02/08/2021    CR 0.45 02/08/2021    GLU 87 02/08/2021    CA 8.6 02/08/2021    ALBUMIN 3.0 02/08/2021    LACTIC 0.6 01/29/2021    TOTBILI 0.2 02/08/2021 Coagulation: No results found for: PT, PTT, INR      Anesthesia Plan    ASA score: 3   Plan: MAC  Induction method: intravenous  NPO status: acceptable      Informed Consent  Anesthetic plan and risks discussed with patient.  Use of blood products discussed with patient      Plan discussed with: anesthesiologist and CRNA.

## 2021-02-08 NOTE — Anesthesia Post-Procedure Evaluation
Post-Anesthesia Evaluation    Name: Kim Matthews      MRN: [id number]     DOB: Oct 18, 1975     Age: 45 y.o.     Sex: female   __________________________________________________________________________     Procedure Information     Anesthesia Start Date/Time: 02/08/21 1247    Scheduled providers: Charlene Brooke, Herbert Seta, DO    Procedure: TRANSESOPHAGEAL ECHO    Location: Cardiology:[facility]          Post-Anesthesia Vitals  BP: 123/89 (05/17 1339)  Pulse: 99 (05/17 1339)  Respirations: 16 PER MINUTE (05/17 1339)  SpO2: 94 % (05/17 1339)  O2 Device: None (Room air) (05/17 1339)  Height: 157.5 cm (5\' 2" ) (05/17 1305)   Vitals Value Taken Time   BP 123/89 02/08/21 1339   Temp     Pulse 99 02/08/21 1339   Respirations 16 PER MINUTE 02/08/21 1339   SpO2 94 % 02/08/21 1339   O2 Device None (Room air) 02/08/21 1339   ABP     ART BP           Post Anesthesia Evaluation Note    Evaluation location: Pre/Post  Patient participation: recovered; patient participated in evaluation  Level of consciousness: alert    Pain score: 0  Pain management: adequate    Hydration: normovolemia  Temperature: 36.0C - 38.4C  Airway patency: adequate    Perioperative Events       Post-op nausea and vomiting: no PONV    Postoperative Status  Cardiovascular status: hemodynamically stable  Respiratory status: spontaneous ventilation        Perioperative Events  There were no known complications for this encounter.

## 2021-02-09 ENCOUNTER — Encounter: Admit: 2021-02-09 | Discharge: 2021-02-09 | Payer: BC Managed Care – PPO

## 2021-02-09 ENCOUNTER — Inpatient Hospital Stay: Admit: 2021-02-09 | Discharge: 2021-02-09 | Payer: BC Managed Care – PPO

## 2021-02-09 MED ADMIN — CEFAZOLIN INJ 1GM IVP [210319]: 2 g | INTRAVENOUS | @ 18:00:00 | NDC 60505614200

## 2021-02-09 MED ADMIN — GABAPENTIN 100 MG PO CAP [18309]: 100 mg | ORAL | @ 13:00:00 | NDC 60687058011

## 2021-02-09 MED ADMIN — GABAPENTIN 100 MG PO CAP [18309]: 100 mg | ORAL | @ 02:00:00 | NDC 60687058011

## 2021-02-09 MED ADMIN — ENOXAPARIN 40 MG/0.4 ML SC SYRG [85052]: 40 mg | SUBCUTANEOUS | @ 02:00:00 | NDC 00781324602

## 2021-02-09 MED ADMIN — METHOCARBAMOL 750 MG PO TAB [4972]: 750 mg | ORAL | @ 22:00:00 | NDC 00904705861

## 2021-02-09 MED ADMIN — WATER FOR INJECTION, STERILE IJ SOLN [79513]: 20 mL | INTRAVENOUS | @ 18:00:00 | Stop: 2021-02-09 | NDC 00409488723

## 2021-02-09 MED ADMIN — SODIUM CHLORIDE 0.9 % IJ SYRG [86437]: 10 mL | INTRAVENOUS | @ 13:00:00 | NDC 08290306546

## 2021-02-09 MED ADMIN — OXYCODONE 5 MG PO TAB [10814]: 5 mg | ORAL | @ 23:00:00 | NDC 00406055223

## 2021-02-09 MED ADMIN — ACETAMINOPHEN 500 MG PO TAB [102]: 1000 mg | ORAL | @ 10:00:00 | NDC 00904673061

## 2021-02-09 MED ADMIN — CEFAZOLIN INJ 1GM IVP [210319]: 2 g | INTRAVENOUS | @ 02:00:00 | NDC 60505614200

## 2021-02-09 MED ADMIN — ACETAMINOPHEN 500 MG PO TAB [102]: 1000 mg | ORAL | @ 02:00:00 | NDC 00904673061

## 2021-02-09 MED ADMIN — WATER FOR INJECTION, STERILE IJ SOLN [79513]: 20 mL | INTRAVENOUS | @ 02:00:00 | Stop: 2021-02-09 | NDC 00409488723

## 2021-02-09 MED ADMIN — OXYCODONE 5 MG PO TAB [10814]: 10 mg | ORAL | @ 06:00:00 | NDC 00406055223

## 2021-02-09 MED ADMIN — CEFAZOLIN INJ 1GM IVP [210319]: 2 g | INTRAVENOUS | @ 10:00:00 | NDC 60505614200

## 2021-02-09 MED ADMIN — ACETAMINOPHEN 500 MG PO TAB [102]: 1000 mg | ORAL | @ 18:00:00 | NDC 00904673061

## 2021-02-09 MED ADMIN — WATER FOR INJECTION, STERILE IJ SOLN [79513]: 20 mL | INTRAVENOUS | @ 10:00:00 | Stop: 2021-02-09 | NDC 00409488723

## 2021-02-09 MED ADMIN — OXYCODONE 5 MG PO TAB [10814]: 10 mg | ORAL | @ 10:00:00 | NDC 00406055223

## 2021-02-09 NOTE — Progress Notes
Home Infusion Benefits:    Therapy(s) quoted: Cefazolin 2g Q8     Drug Copay: $28.06  Supply Copay: $136.53    Total estimated expense for 7 days of therapy: $164.59, after remaining OOP of $173.35 is met, then patient is covered 100%.

## 2021-02-10 ENCOUNTER — Encounter: Admit: 2021-02-10 | Discharge: 2021-02-10 | Payer: BC Managed Care – PPO

## 2021-02-10 MED ADMIN — CEFAZOLIN INJ 1GM IVP [210319]: 2 g | INTRAVENOUS | @ 02:00:00 | NDC 60505614200

## 2021-02-10 MED ADMIN — ENOXAPARIN 40 MG/0.4 ML SC SYRG [85052]: 40 mg | SUBCUTANEOUS | @ 02:00:00 | NDC 00781324602

## 2021-02-10 MED ADMIN — POLYETHYLENE GLYCOL 3350 17 GRAM PO PWPK [25424]: 34 g | ORAL | @ 02:00:00 | NDC 00904693186

## 2021-02-10 MED ADMIN — CEFAZOLIN INJ 1GM IVP [210319]: 2 g | INTRAVENOUS | @ 18:00:00 | Stop: 2021-02-10 | NDC 60505614200

## 2021-02-10 MED ADMIN — ACETAMINOPHEN 500 MG PO TAB [102]: 1000 mg | ORAL | @ 02:00:00 | NDC 00904673061

## 2021-02-10 MED ADMIN — WATER FOR INJECTION, STERILE IJ SOLN [79513]: 20 mL | INTRAVENOUS | @ 18:00:00 | Stop: 2021-02-10 | NDC 00409488723

## 2021-02-10 MED ADMIN — CEFAZOLIN INJ 1GM IVP [210319]: 2 g | INTRAVENOUS | @ 10:00:00 | Stop: 2021-02-10 | NDC 60505614200

## 2021-02-10 MED ADMIN — ACETAMINOPHEN 500 MG PO TAB [102]: 1000 mg | ORAL | @ 10:00:00 | Stop: 2021-02-10 | NDC 00904673061

## 2021-02-10 MED ADMIN — ACETAMINOPHEN 500 MG PO TAB [102]: 1000 mg | ORAL | @ 18:00:00 | Stop: 2021-02-10 | NDC 00904673061

## 2021-02-10 MED ADMIN — GABAPENTIN 100 MG PO CAP [18309]: 100 mg | ORAL | @ 14:00:00 | Stop: 2021-02-10 | NDC 60687058011

## 2021-02-10 MED ADMIN — GABAPENTIN 100 MG PO CAP [18309]: 100 mg | ORAL | @ 02:00:00 | NDC 60687058011

## 2021-02-10 MED FILL — OXYCODONE 5 MG PO TAB: 5 mg | ORAL | 4 days supply | Qty: 15 | Fill #1 | Status: CP

## 2021-02-10 MED FILL — GABAPENTIN 100 MG PO CAP: 100 mg | ORAL | 30 days supply | Qty: 60 | Fill #1 | Status: CP

## 2021-02-11 ENCOUNTER — Encounter: Admit: 2021-02-11 | Discharge: 2021-02-11 | Payer: BC Managed Care – PPO

## 2021-02-14 ENCOUNTER — Encounter: Admit: 2021-02-14 | Discharge: 2021-02-14 | Payer: BC Managed Care – PPO

## 2021-02-14 DIAGNOSIS — R1084 Generalized abdominal pain: Secondary | ICD-10-CM

## 2021-02-14 DIAGNOSIS — M4626 Osteomyelitis of vertebra, lumbar region: Secondary | ICD-10-CM

## 2021-02-14 DIAGNOSIS — K6812 Psoas muscle abscess: Secondary | ICD-10-CM

## 2021-02-14 LAB — CBC AND DIFF
HEMOGLOBIN: 10 K/UL (ref 150–400)
PLATELET COUNT: 535 % — ABNORMAL HIGH (ref >60)
WBC COUNT: 10 g/dL (ref 32.0–36.0)

## 2021-02-14 LAB — COMPREHENSIVE METABOLIC PANEL

## 2021-02-23 ENCOUNTER — Encounter: Admit: 2021-02-23 | Discharge: 2021-02-23 | Payer: BC Managed Care – PPO

## 2021-02-23 DIAGNOSIS — R1084 Generalized abdominal pain: Secondary | ICD-10-CM

## 2021-02-23 DIAGNOSIS — M4626 Osteomyelitis of vertebra, lumbar region: Secondary | ICD-10-CM

## 2021-02-23 DIAGNOSIS — K6812 Psoas muscle abscess: Secondary | ICD-10-CM

## 2021-02-23 LAB — COMPREHENSIVE METABOLIC PANEL
ALT: <8
AST: 12
BLD UREA NITROGEN: 9
CREATININE: 0.5

## 2021-02-23 LAB — SED RATE: ESR: 93

## 2021-02-23 LAB — C REACTIVE PROTEIN (CRP): C-REACTIVE PROTEIN: 14

## 2021-02-23 LAB — CBC AND DIFF: WBC COUNT: 8.1

## 2021-02-24 ENCOUNTER — Encounter: Admit: 2021-02-24 | Discharge: 2021-02-24 | Payer: BC Managed Care – PPO

## 2021-02-24 NOTE — Telephone Encounter
-----   Message from Zorita Pang, DO sent at 02/24/2021  5:30 AM CDT -----  Can you call her regarding her low potassium?  Let's have her take KCl 20 meq daily x5 days, also provide info for potassium rich foods she can eat.    Thanks  [employer]

## 2021-02-24 NOTE — Telephone Encounter
Reached out to patient and she is currently on low dose K at per day for hx of low K, and her PCP had her up it to daily starting yesterday due to low K of 3.1 on this most recent lab.  They will also follow her K levels and will manage levels.   PCP - [facility]

## 2021-03-01 ENCOUNTER — Encounter: Admit: 2021-03-01 | Discharge: 2021-03-01 | Payer: BC Managed Care – PPO

## 2021-03-08 ENCOUNTER — Encounter: Admit: 2021-03-08 | Discharge: 2021-03-08 | Payer: BC Managed Care – PPO

## 2021-03-08 ENCOUNTER — Ambulatory Visit: Admit: 2021-03-08 | Discharge: 2021-03-08 | Payer: BC Managed Care – PPO

## 2021-03-08 DIAGNOSIS — E785 Hyperlipidemia, unspecified: Secondary | ICD-10-CM

## 2021-03-08 DIAGNOSIS — R1084 Generalized abdominal pain: Secondary | ICD-10-CM

## 2021-03-08 DIAGNOSIS — E039 Hypothyroidism, unspecified: Secondary | ICD-10-CM

## 2021-03-08 DIAGNOSIS — M4626 Osteomyelitis of vertebra, lumbar region: Secondary | ICD-10-CM

## 2021-03-08 DIAGNOSIS — K6812 Psoas muscle abscess: Secondary | ICD-10-CM

## 2021-03-08 DIAGNOSIS — I10 Essential (primary) hypertension: Secondary | ICD-10-CM

## 2021-03-08 LAB — COMPREHENSIVE METABOLIC PANEL
ALK PHOSPHATASE: 187
ALT: <8
AST: 39
BLD UREA NITROGEN: 4
CREATININE: 0.6

## 2021-03-08 LAB — SED RATE: ESR: 81

## 2021-03-08 LAB — CBC AND DIFF: WBC COUNT: 6.6

## 2021-03-08 LAB — C REACTIVE PROTEIN (CRP): C-REACTIVE PROTEIN: 27

## 2021-03-08 MED ORDER — LINEZOLID 600 MG PO TAB
600 mg | ORAL_TABLET | Freq: Two times a day (BID) | ORAL | 0 refills | Status: AC
Start: 2021-03-08 — End: ?

## 2021-03-08 NOTE — Telephone Encounter
Per Dr Brandon Melnick:  removing PICC and switching to PO for last week    1) DC cefazolin 03/08/21  Start Linezolid 600mg  PO BID on 03/08/21 - script sent from clinic visit by Dr 03/10/21 to patient pharmacy of choice    2) PICC removed in ID clinic    3) DC weekly lab orders.    Orders confirmed by Brandon Melnick at [facility] with [facility] informed.      OPAT COMPLETE at this time.

## 2021-03-11 ENCOUNTER — Encounter: Admit: 2021-03-11 | Discharge: 2021-03-11 | Payer: BC Managed Care – PPO

## 2021-03-11 DIAGNOSIS — I10 Essential (primary) hypertension: Secondary | ICD-10-CM

## 2021-03-11 DIAGNOSIS — E039 Hypothyroidism, unspecified: Secondary | ICD-10-CM

## 2021-03-11 DIAGNOSIS — E785 Hyperlipidemia, unspecified: Secondary | ICD-10-CM

## 2021-03-14 ENCOUNTER — Encounter: Admit: 2021-03-14 | Discharge: 2021-03-14 | Payer: BC Managed Care – PPO

## 2021-03-14 DIAGNOSIS — G959 Disease of spinal cord, unspecified: Secondary | ICD-10-CM

## 2021-03-14 NOTE — Patient Instructions
It was nice to see you today.  Thank you for choosing to visit our clinic.  Your time is important, and if you had to wait today, we do apologize.  Our goal is to run exactly on time.  However, on occasion, we get behind in clinic due to unexpected patient issues.  Thank you for your patience.    General Instructions:   Scheduling:  Our scheduling phone number is [phone].   Appointment Reminders on your cell phone:  Make sure we have your cell phone number in your account, and text [facility] to [phone].   How to reach our office:  Please send a MyChart message to the [facility] or leave a voicemail for the nurse Jonmichael Beadnell, RN, BSN at [phone].   How to get a medication refill:  Please use the MyChart Refill request or contact your pharmacy directly to request medication refills.  Please allow 72 business hours for request to be completed.     Support for many chronic illnesses is available through [facility] at [email or web address] or [phone].     For help with MyChart:  please call [phone].     For questions on nights, weekends or holidays:  call the Operator at [phone], and ask for the doctor on call for Neurosurgery.     For more information on spinal conditions:  please visit [email or web address]    Our office fax number is [phone]    Again, thank you for coming in today.       Krishauna Schatzman, RN, BSN  Clinical Nurse Coordinator for Dr. Ifije Ohiorhenuan  Ambulatory Clinic Nurse Supervisor  Marc A. Asher, MD, [facility]  [facility]  Phone [phone]  Fax [phone]

## 2021-03-22 ENCOUNTER — Encounter: Admit: 2021-03-22 | Discharge: 2021-03-22 | Payer: BC Managed Care – PPO

## 2021-03-22 ENCOUNTER — Ambulatory Visit: Admit: 2021-03-22 | Discharge: 2021-03-22 | Payer: BC Managed Care – PPO

## 2021-03-22 DIAGNOSIS — E785 Hyperlipidemia, unspecified: Secondary | ICD-10-CM

## 2021-03-22 DIAGNOSIS — I10 Essential (primary) hypertension: Secondary | ICD-10-CM

## 2021-03-22 DIAGNOSIS — G959 Disease of spinal cord, unspecified: Secondary | ICD-10-CM

## 2021-03-22 DIAGNOSIS — E039 Hypothyroidism, unspecified: Secondary | ICD-10-CM

## 2021-03-22 DIAGNOSIS — M48061 Spinal stenosis, lumbar region without neurogenic claudication: Secondary | ICD-10-CM

## 2021-03-22 NOTE — Progress Notes
Date of Service: 03/22/2021        Chief complaint    Chief Complaint   Patient presents with   ? Lower Back - Pain   ? Right Leg - Pain   ? Follow Up     IP 02-07-21 L1-2 Osteodiscitis       HPI  Kim Matthews is a 45 y.o. 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  She notes worsening back pain over the last 2 weeks. She was initially doing well after she was discharged to the hospital. Then she went to [facility] overexerted herself after that started having some significant low back pain.  She complains of back pain worse with activity relieved with rest.  She also reports some numbness and tingling her legs.  She states that none of her symptoms are new.  Her PICC line was taken out and she is transition to oral antibiotics.    PMH    Medical History:   Diagnosis Date   ? HLD (hyperlipidemia)    ? HTN (hypertension)    ? Hypothyroid            ROS  Review of Systems   Constitutional: Positive for activity change.   Musculoskeletal: Positive for back pain and myalgias.   Allergic/Immunologic: Positive for food allergies.   Psychiatric/Behavioral: The patient is nervous/anxious.    All other systems reviewed and are negative.         FH    No family history on file.  Social History     Socioeconomic History   ? Marital status: Married   Tobacco Use   ? Smoking status: Current Every Day Smoker   ? Smokeless tobacco: Never Used   ? Tobacco comment: Declined Nicotine patch   Vaping Use   ? Vaping Use: Former           SH    Surgical History:   Procedure Laterality Date   ? CHOLECYSTECTOMY     ? HYSTERECTOMY         Meds    ? albuterol sulfate (PROAIR HFA) 90 mcg/actuation HFA aerosol inhaler Inhale 2 puffs by mouth into the lungs every 6 hours as needed for Wheezing or Shortness of Breath. Shake well before use.   ? aspirin/acetaminophen/caffeine (EXCEDRIN MIGRAINE) 250/250/65 mg tablet Take 2 tablets by mouth every 6 hours as needed.   ? cyclobenzaprine (FLEXERIL) 10 mg tablet Take 10 mg by mouth every 8 hours.   ? cyclobenzaprine (FLEXERIL) 5 mg tablet Take 5 mg by mouth three times daily.   ? gabapentin (NEURONTIN) 100 mg capsule Take one capsule by mouth twice daily.   ? HYDROcodone/acetaminophen (NORCO) 10/325 mg tablet TAKE 1 TABLET BY MOUTH FOUR TIMES DAILY AS NEEDED   ? ibuprofen (ADVIL) 200 mg tablet Take 800 mg by mouth every 8 hours as needed for Pain. Take with food.   ? lisinopriL (ZESTRIL) 20 mg tablet Take 20 mg by mouth daily.   ? loratadine (CLARITIN) 10 mg tablet Take 10 mg by mouth twice daily.   ? menthol (BIOFREEZE (MENTHOL)) 4 % gel Apply  topically to affected area daily as needed.   ? multivitamin-folic acid-biotin (HAIR-SKIN-NAILS (MV-FA-BIOTIN)) 400-2,000 mcg tab Take 1 tablet by mouth twice daily.       Allergies    Allergies   Allergen Reactions   ? Banana SWOLLEN TONGUE     Patient actually endorsed a swollen throat reaction.   ? Tree Nuts  SWOLLEN TONGUE     Patient actually endorsed a swollen throat reaction.   ? Watermelon SWOLLEN TONGUE     Patient actually endorsed a swollen throat reaction.   ? Penicillins HIVES   ? Wellbutrin [Bupropion Hcl] HIVES         Exam  Physical Exam   Constitutional: she is oriented to person, place, and time. she appears well-developed and well-nourished.    Head: Normocephalic and atraumatic.    Eyes: Conjunctivae and EOM are normal.    Pulmonary/Chest: Effort normal. No respiratory distress.   Neurological: she is alert and oriented to person, place, and time. No cranial nerve deficit or sensory deficit. she exhibits normal muscle tone. Coordination normal.   Skin: Skin is warm and dry.   Psychiatric: she has a normal mood and affect. her behavior is normal. Judgment and thought content normal.    Vitals reviewed.  A&Ox3  FS, TML, EOMI      D B Tr Hg IO  R 5/5 5/5 5/5 5/5 5/5  L 5/5 5/5 5/5 5/5 5/5        HF KE DF PF EHL  R  5/5 5/5 5/5 5/5 5/5  L  5/5 5/5 5/5 5/5 5/5      Vitals:   Vitals:    03/22/21 1509   BP: 124/88   Pulse: 96 Resp: 18   SpO2: 100%   PainSc: Eight   Weight: 53.1 kg (117 lb)   Height: 152.4 cm (5')     Body mass index is 22.85 kg/m?.      Imaging:   I independently reviewed the patient's imaging findings:  X-rays reviewed notable for endplate fracture at L2 and progression of kyphosis at L1/2    Assessment/Plan    45 year old female with history of osteodiscitis who presents with progressive low back pain  Imaging notable for progression of kyphosis at L2  She is a current smoker.  Strongly encouraged her to stop smoking  We will obtain an MRI with and without contrast  She return to see me as soon as this is done                                Encounter Medications   Medications   ? HYDROcodone/acetaminophen (NORCO) 10/325 mg tablet     Sig: TAKE 1 TABLET BY MOUTH FOUR TIMES DAILY AS NEEDED   ? cyclobenzaprine (FLEXERIL) 5 mg tablet     Sig: Take 5 mg by mouth three times daily.                              Please note that documentation of records were done during a busy neurosurgical clinic. Attempts have been made to review the document for any errors. Please excuse for brevity and typographical errors.

## 2021-04-06 IMAGING — MR MRI LS-Spine, w/o
5 of 6 series · 22 of 48 positions shown · non-contrast
Comparison: none

MRI LUMBAR SPINE
HISTORY: Lumbar radiculopathy, diagnosed with osteomyelitis in May and was treated.
TECHNIQUE: T1, T2 and STIR sagittal with T1 and T2 axial images obtained without contrast. Comparison is made to a prior MRI dated January 28, 2021.

[Series 101: survey · axial · 10.0mm · 1.56mm/px · z∈[-15,+199]mm · 3 of 9 slices shown]
[im 1/9]
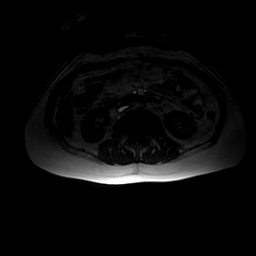
[im 5/9]
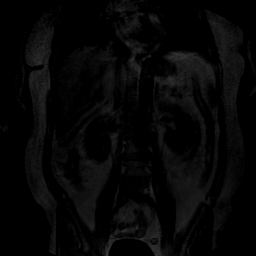
[im 9/9]
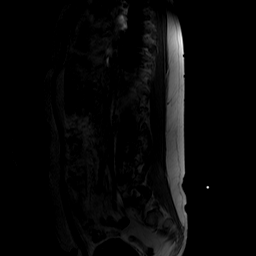

[Series 301: t2w_drive · sagittal · 4.0mm · 0.49mm/px · 5 of 16 slices shown]
[im 1/16]
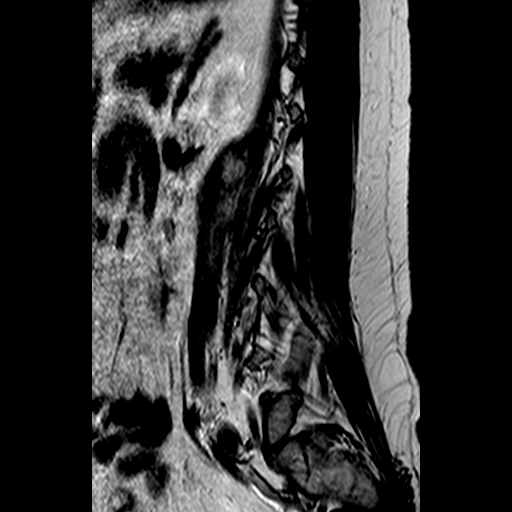
[im 4/16]
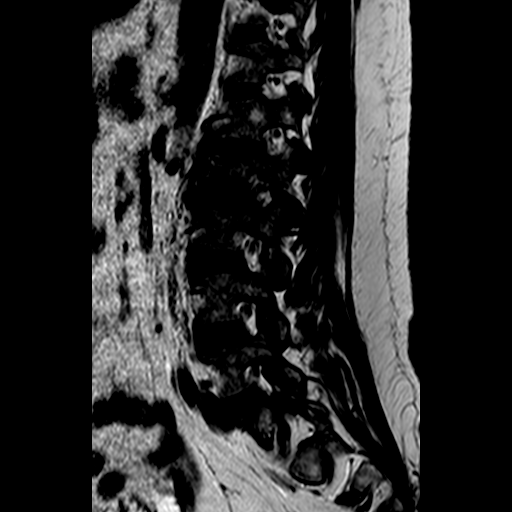
[im 8/16]
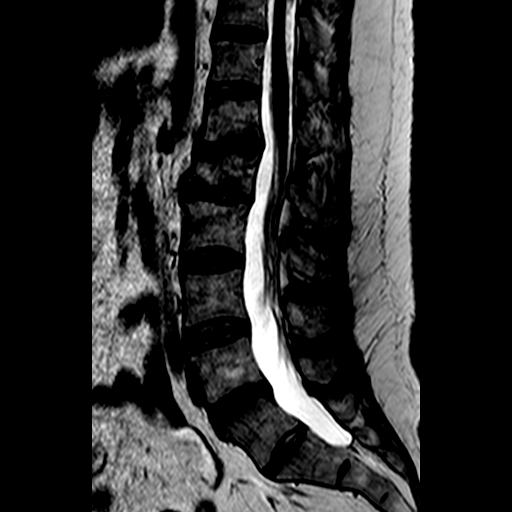
[im 12/16]
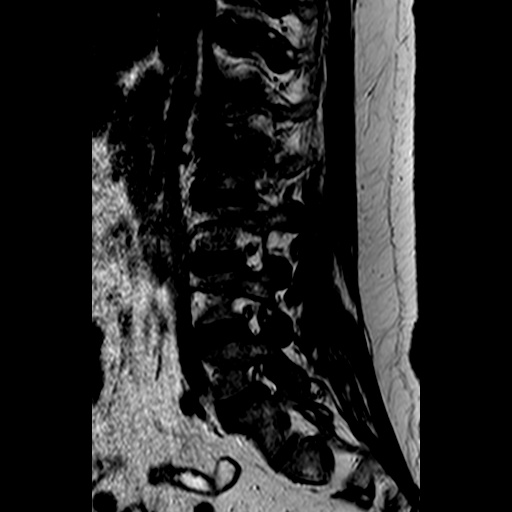
[im 16/16]
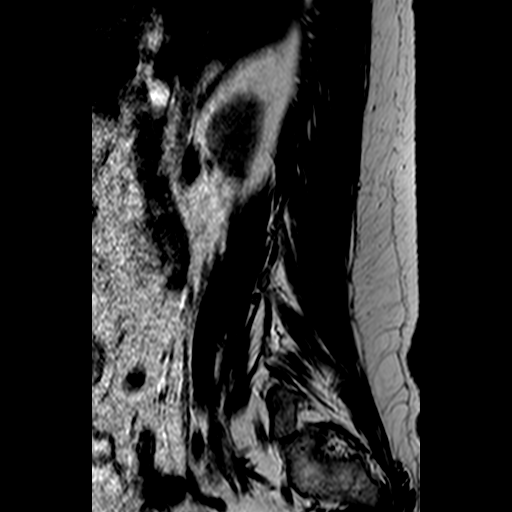

[Series 401: stir_opt. · sagittal · 4.0mm · 0.48mm/px · 5 of 16 slices shown]
[im 1/16]
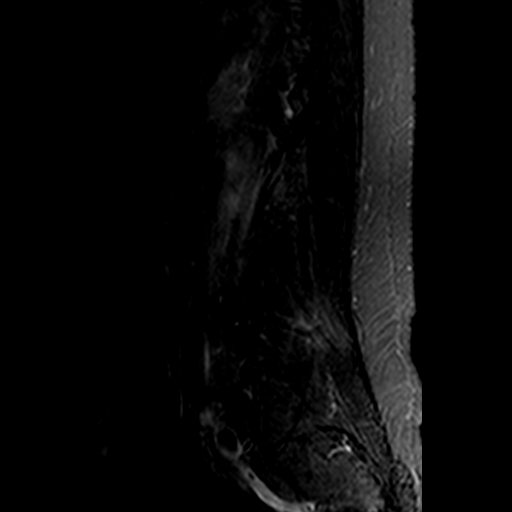
[im 4/16]
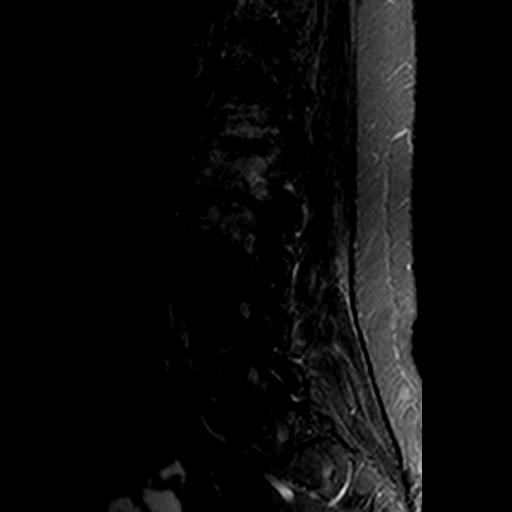
[im 8/16]
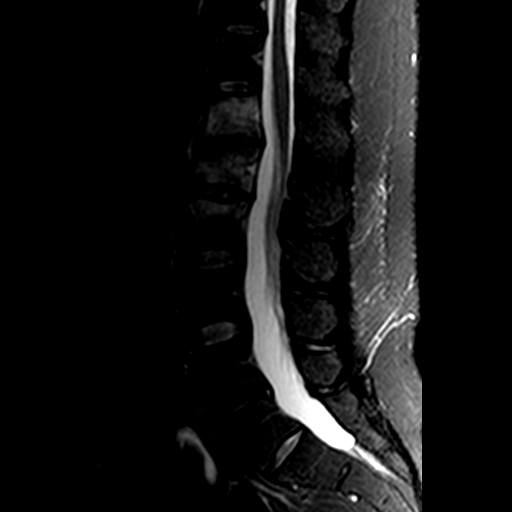
[im 12/16]
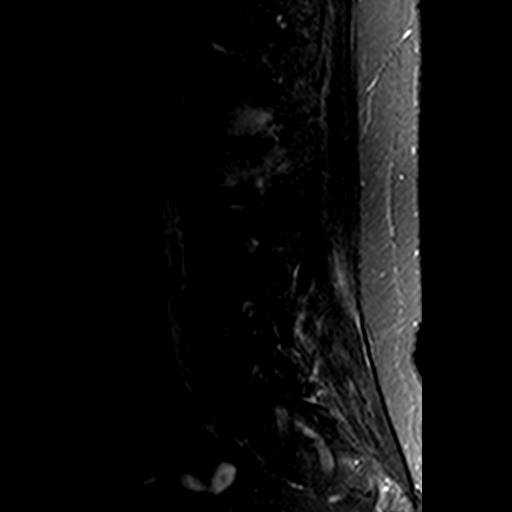
[im 16/16]
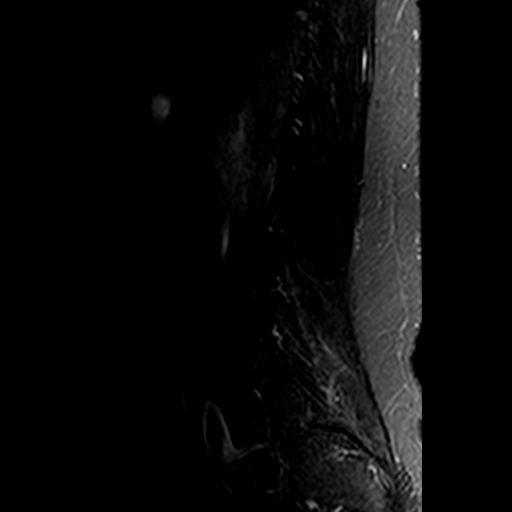

[Series 502: et1w_tse · sagittal · 4.0mm · 0.48mm/px · 5 of 16 slices shown]
[im 1/16]
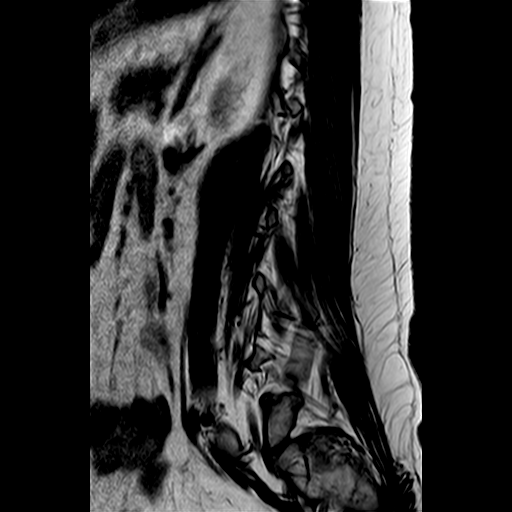
[im 4/16]
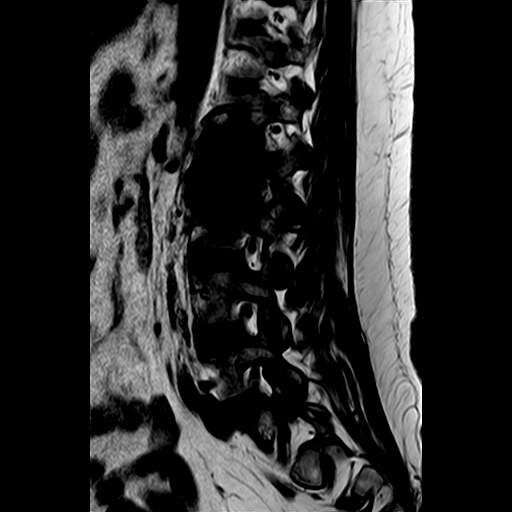
[im 8/16]
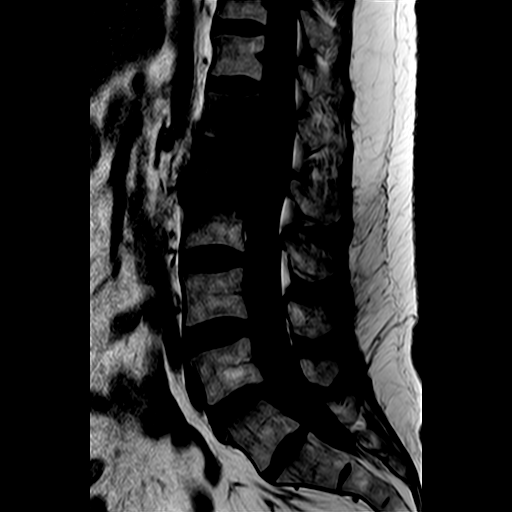
[im 12/16]
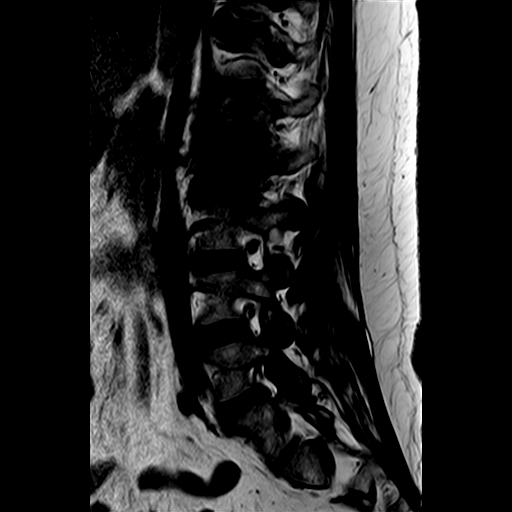
[im 16/16]
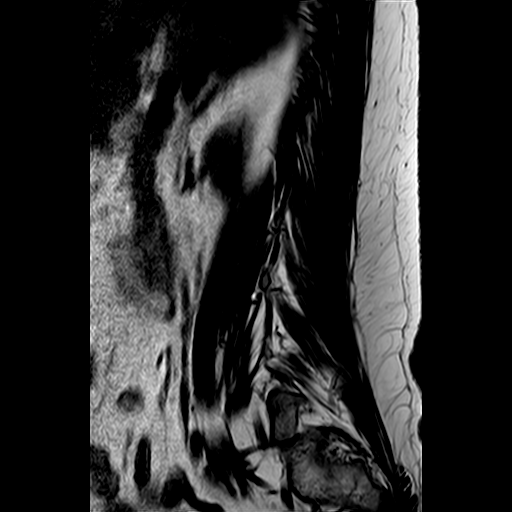

[Series 602: et1 ax stack_ · axial · 4.0mm · 0.41mm/px · z∈[-150,-67]mm · 4 of 46 slices shown]
[im 1/46]
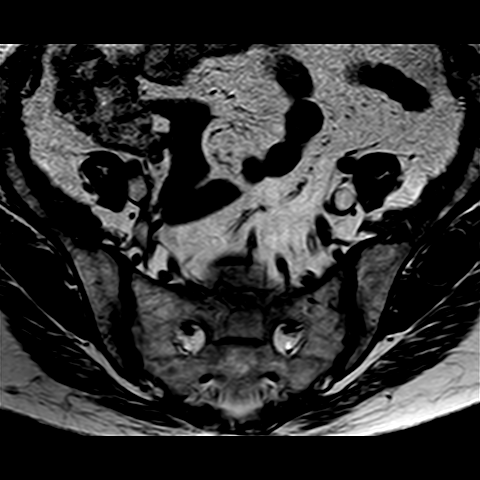
[im 7/46]
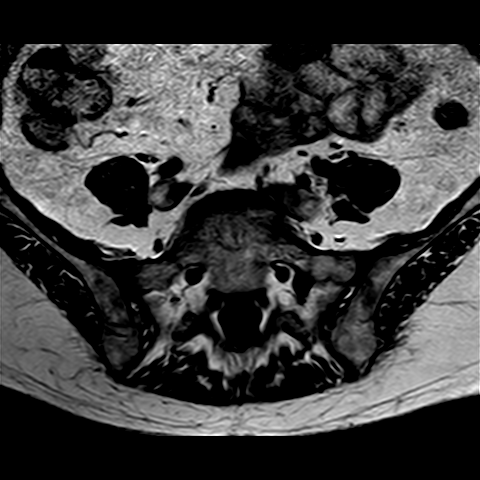
[im 13/46]
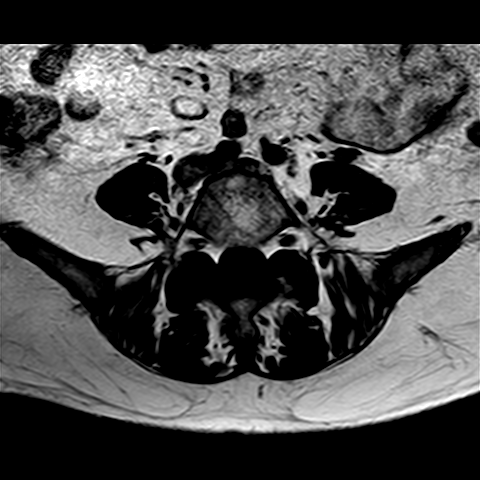
[im 20/46]
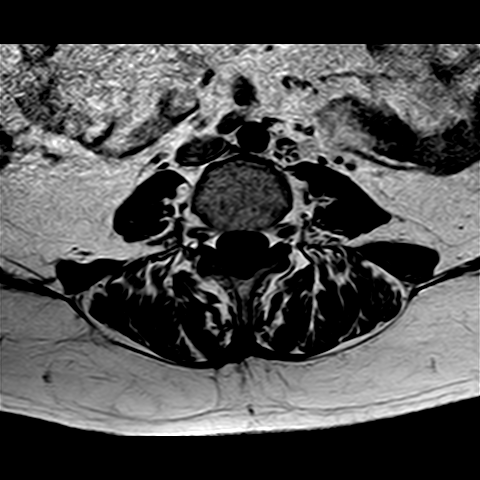

[22 of 48 positions shown; findings below may reference images not displayed]

FINDINGS: 
IMPRESSION: Chronic discitis/osteomyelitis appearance at L1/L2 and L2/L3 progressed since the prior MRI. Resolution of the bilateral psoas abscesses since the prior study. Stable degenerative signal alteration at the L5-S1 disc space.

## 2021-05-03 ENCOUNTER — Encounter: Admit: 2021-05-03 | Discharge: 2021-05-03 | Payer: BC Managed Care – PPO

## 2021-06-01 ENCOUNTER — Ambulatory Visit: Admit: 2021-06-01 | Discharge: 2021-06-01 | Payer: BC Managed Care – PPO

## 2021-06-01 ENCOUNTER — Encounter: Admit: 2021-06-01 | Discharge: 2021-06-01 | Payer: BC Managed Care – PPO

## 2021-06-01 DIAGNOSIS — E039 Hypothyroidism, unspecified: Secondary | ICD-10-CM

## 2021-06-01 DIAGNOSIS — M4626 Osteomyelitis of vertebra, lumbar region: Secondary | ICD-10-CM

## 2021-06-01 DIAGNOSIS — I10 Essential (primary) hypertension: Secondary | ICD-10-CM

## 2021-06-01 DIAGNOSIS — E785 Hyperlipidemia, unspecified: Secondary | ICD-10-CM

## 2021-06-01 LAB — COMPREHENSIVE METABOLIC PANEL
ALT: 18 U/L — ABNORMAL HIGH (ref 7–56)
AST: 19 U/L (ref 7–40)
CREATININE: 0.4 mg/dL (ref 0.4–1.00)
GLUCOSE,PANEL: 87 mg/dL (ref 70–100)
POTASSIUM: 4.1 MMOL/L (ref 3.5–5.1)
SODIUM: 141 MMOL/L (ref 137–147)

## 2021-06-01 LAB — CBC AND DIFF
ABSOLUTE BASO COUNT: 0.1 K/UL (ref 0–0.20)
ABSOLUTE EOS COUNT: 0.1 K/UL (ref 0–0.45)
ABSOLUTE LYMPH COUNT: 3 K/UL (ref 1.0–4.8)
ABSOLUTE MONO COUNT: 0.5 K/UL (ref 0–0.80)
ABSOLUTE NEUTROPHIL: 6 K/UL (ref 1.8–7.0)
BASOPHILS %: 1 % (ref 0–2)
EOSINOPHILS %: 2 % (ref 0–5)
HEMATOCRIT: 40 % (ref 36–45)
HEMOGLOBIN: 13 g/dL (ref 12.0–15.0)
LYMPHOCYTES %: 30 % (ref 24–44)
MCHC: 33 g/dL (ref 32.0–36.0)
MCV: 88 FL — ABNORMAL HIGH (ref 80–100)
MONOCYTES %: 5 % (ref 4–12)
MPV: 8.2 FL (ref >60)
NEUTROPHILS %: 62 % (ref 41–77)
PLATELET COUNT: 308 K/UL (ref 150–400)
RBC COUNT: 4.6 M/UL (ref 4.0–5.0)
WBC COUNT: 9.9 K/UL (ref 4.5–11.0)

## 2021-06-01 LAB — SED RATE: ESR: 12 mm/h — ABNORMAL LOW (ref 0–20)

## 2021-06-01 LAB — C REACTIVE PROTEIN (CRP): C-REACTIVE PROTEIN: 0.3 mg/dL (ref <1.0)

## 2021-06-02 ENCOUNTER — Encounter: Admit: 2021-06-02 | Discharge: 2021-06-02 | Payer: BC Managed Care – PPO

## 2021-06-03 ENCOUNTER — Encounter: Admit: 2021-06-03 | Discharge: 2021-06-03 | Payer: BC Managed Care – PPO

## 2021-06-03 DIAGNOSIS — I10 Essential (primary) hypertension: Secondary | ICD-10-CM

## 2021-06-03 DIAGNOSIS — E785 Hyperlipidemia, unspecified: Secondary | ICD-10-CM

## 2021-06-03 DIAGNOSIS — E039 Hypothyroidism, unspecified: Secondary | ICD-10-CM

## 2021-07-27 IMAGING — MG SCRNING DIGITAL BRST TOMOSYNTHESIS BILAT
7 series · 9 of 23 positions shown · non-contrast
Comparison: none

Addendum:
The examination was re-evaluated after appropriate quality control.
Bilateral digital screening mammogram with 3D tomosynthesis dated 07/27/2021
CLINICAL HISTORY: Routine screening, baseline
TECHNIQUE: Bilateral CC and MLO views were obtained with 3D tomosynthesis.

[L CC synth-2D]
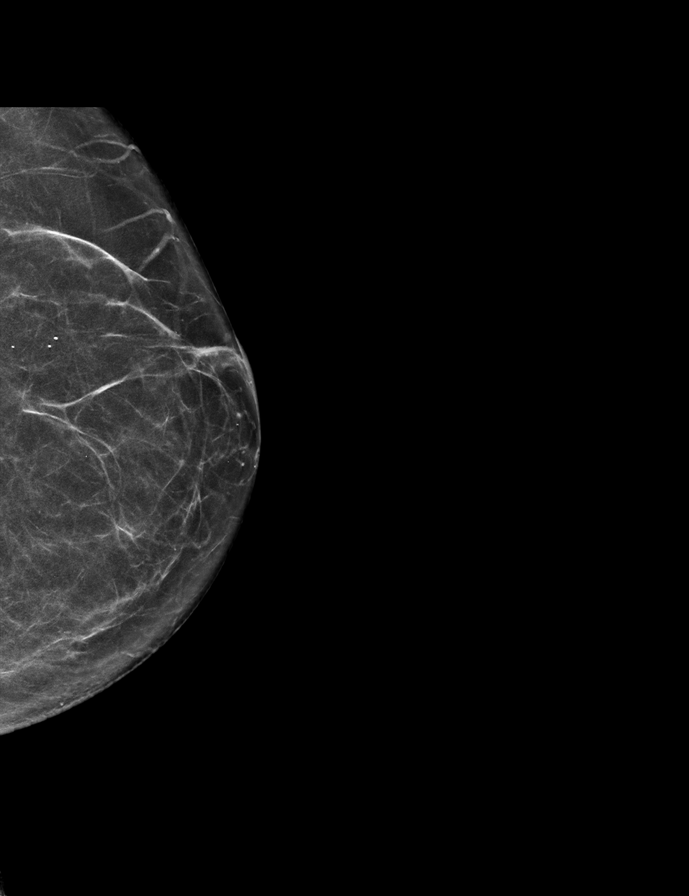

[R MLO synth-2D]
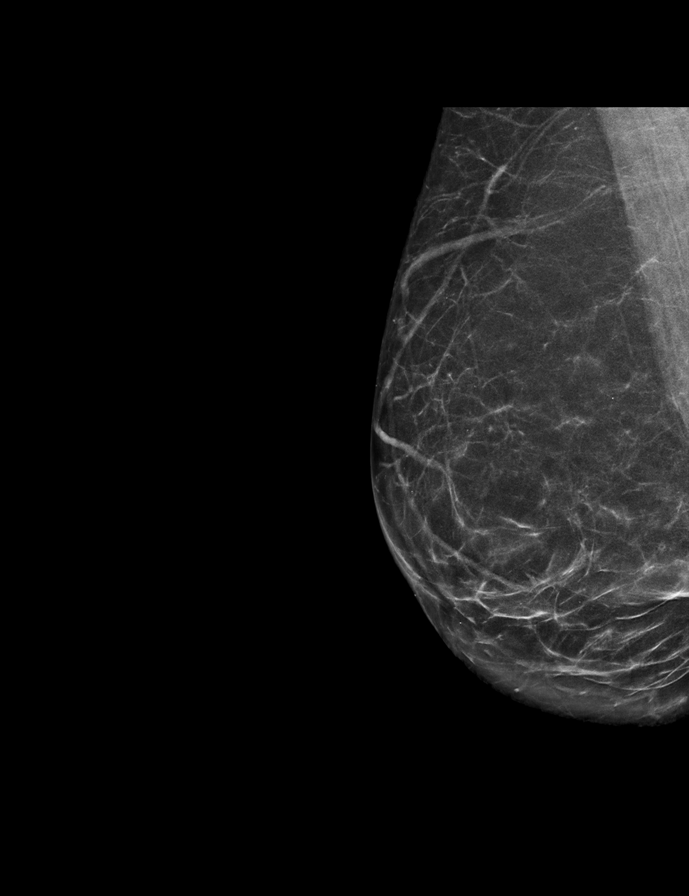

[R CC synth-2D]
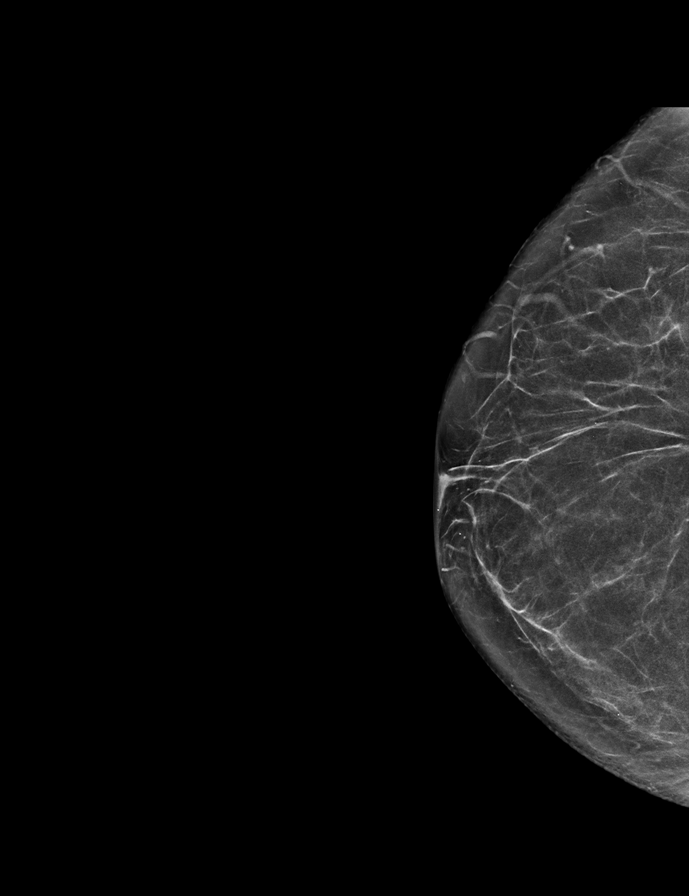

[L CC tomo · 3 of 59 frames shown]
[frame 20/59]
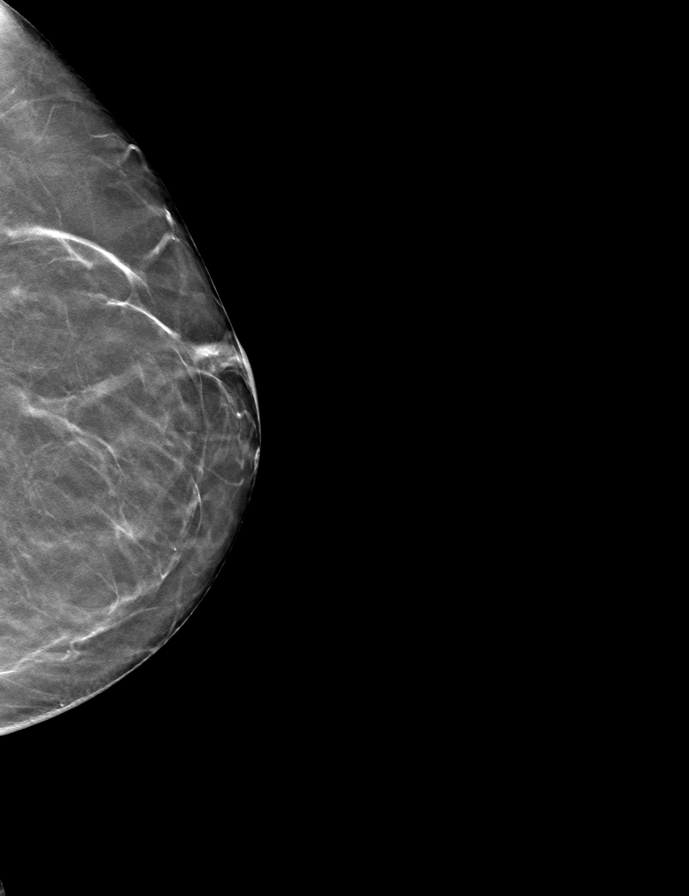
[frame 30/59]
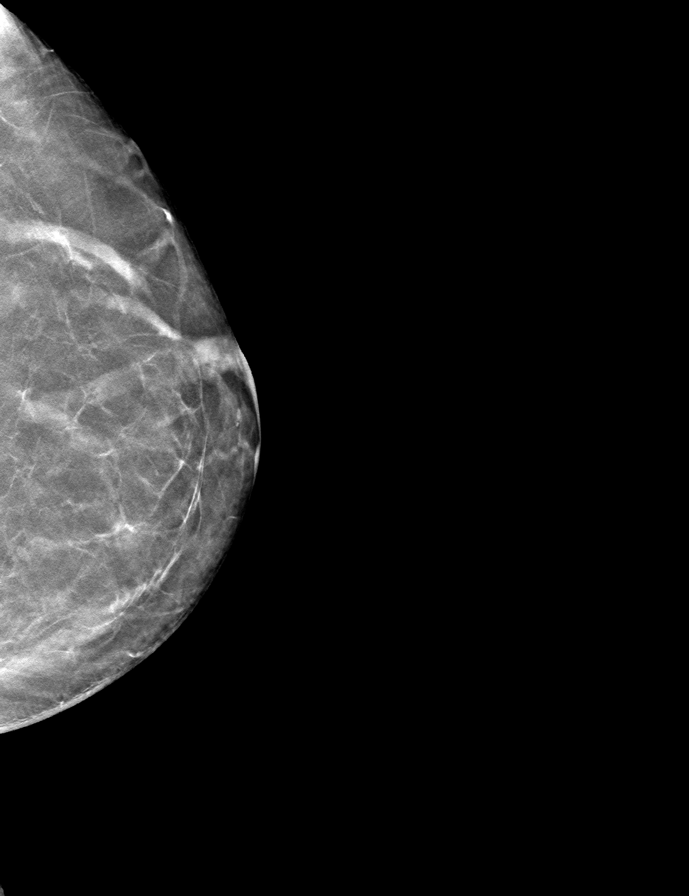
[frame 40/59]
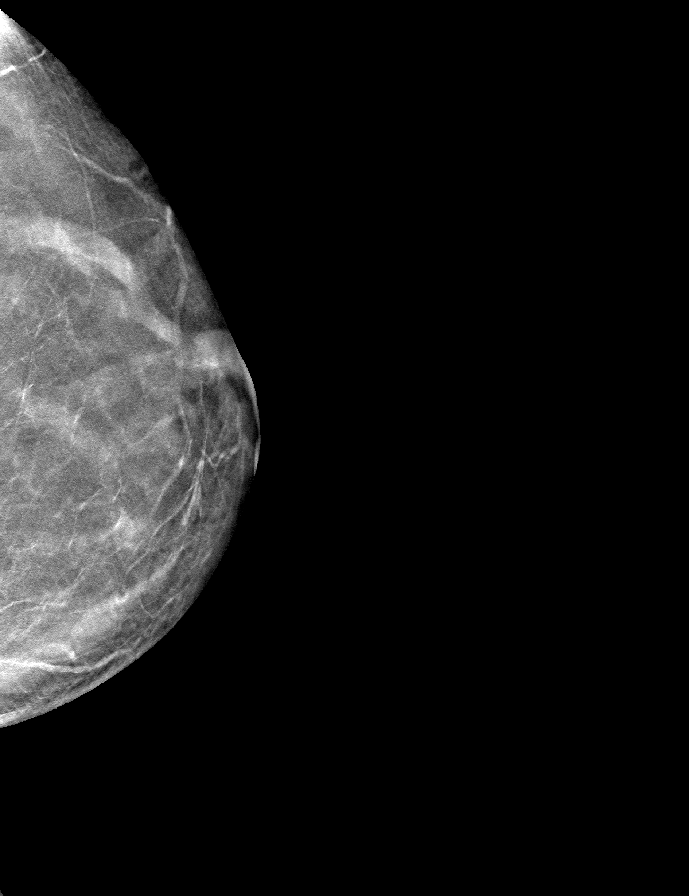

[L MLO tomo · tomo slice 38/75.0]
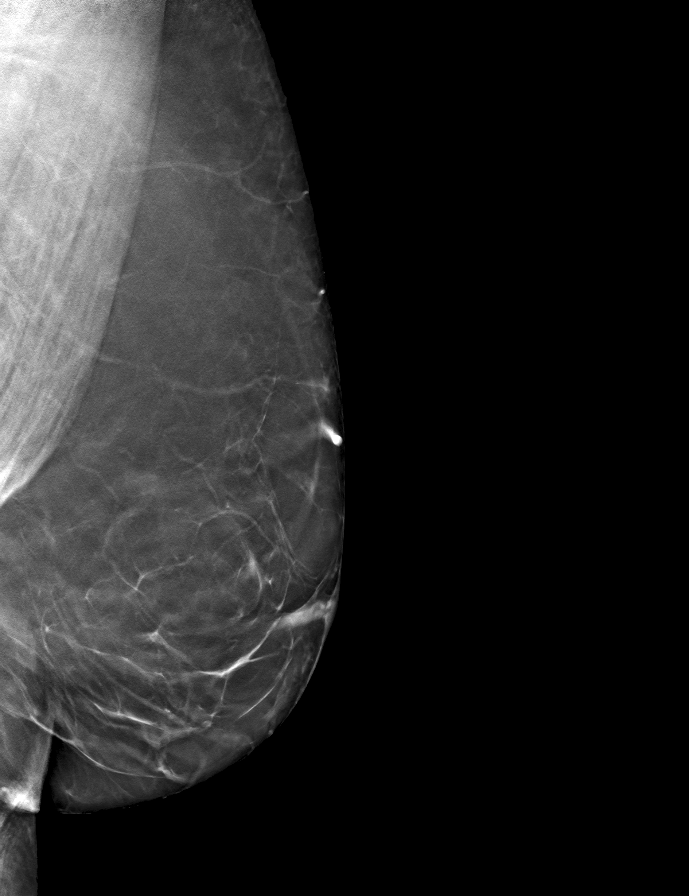

[R MLO tomo · tomo slice 33/65.0]
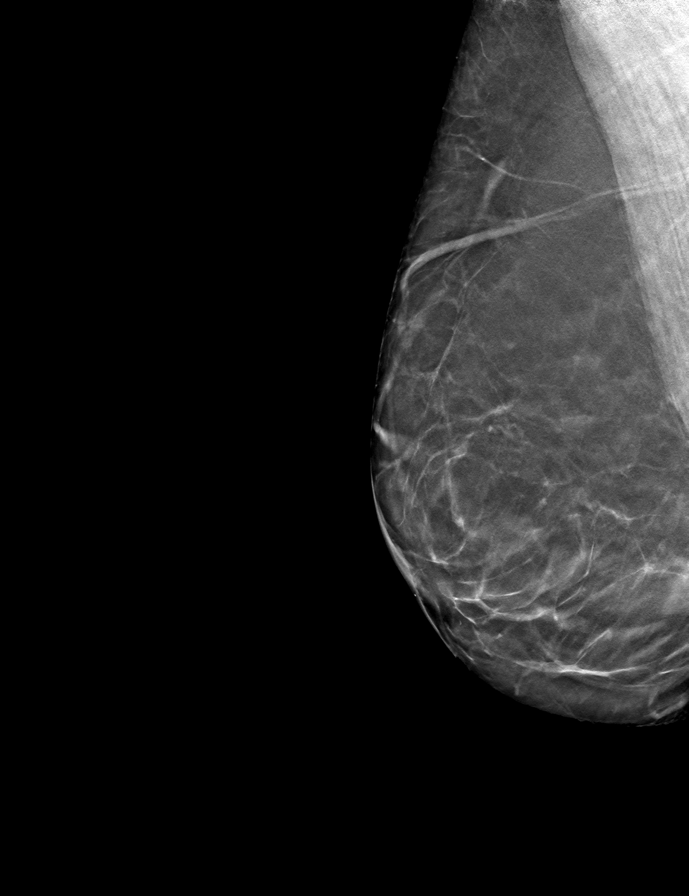

[R CC tomo · tomo slice 30/59.0]
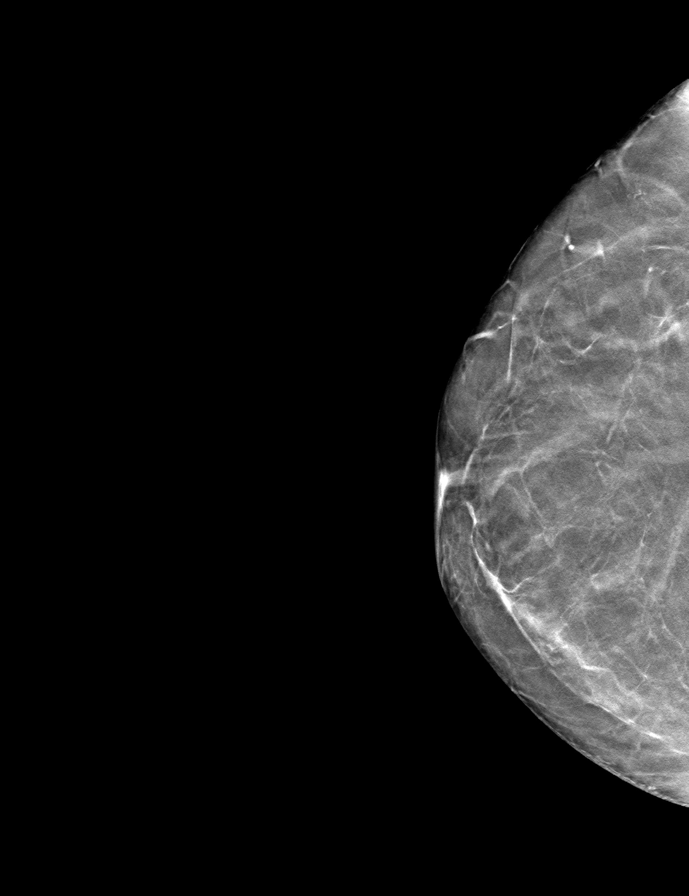

[9 of 23 positions shown; findings below may reference images not displayed]

FINDINGS: No priors.
There are scattered areas of fibroglandular density. No dominant mass, tumor microcalcification, or areas of architectural distortion are seen. Benign calcifications are seen bilaterally.
IMPRESSION: Benign mammogram
Routine annual mammography is recommended
BI-RADS 2: Benign

## 2022-02-13 IMAGING — MR MR lumbar spine wo con
4 of 5 series · 29 of 48 positions shown · non-contrast
Comparison: None.

SPLUMBWO
REASON FOR EXAM: Radiculopathy back pain
TECHNIQUE: Multiplanar, multisequence MRI of the lumbar spines obtained without contrast

[Series 201: T1 · sagittal · 3.0mm · 0.65mm/px · 18 acquisitions, 8 frames shown]
[im 1/18]
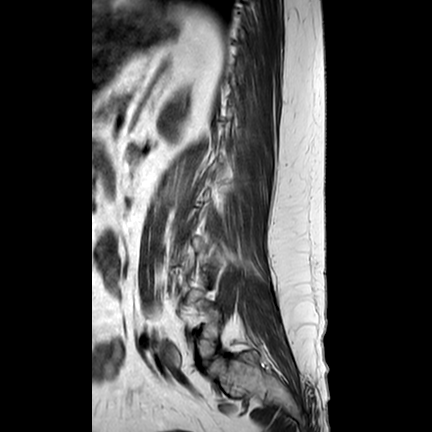
[im 5/18]
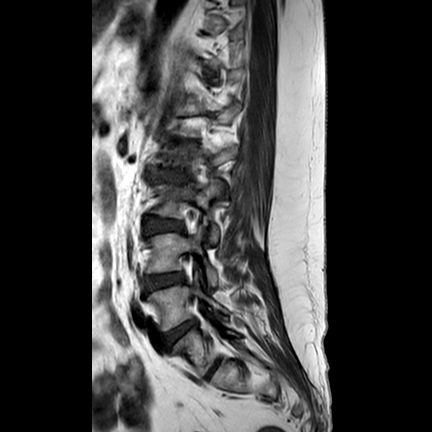
[im 8/18]
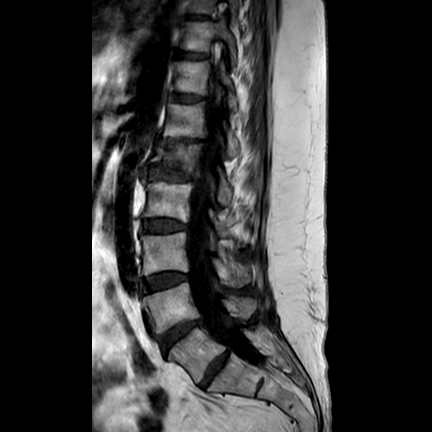
[im 18/18]
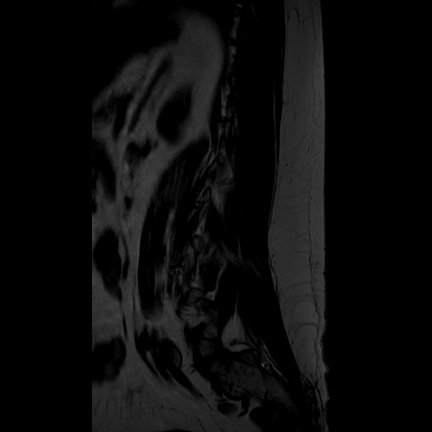
[im 18/18]
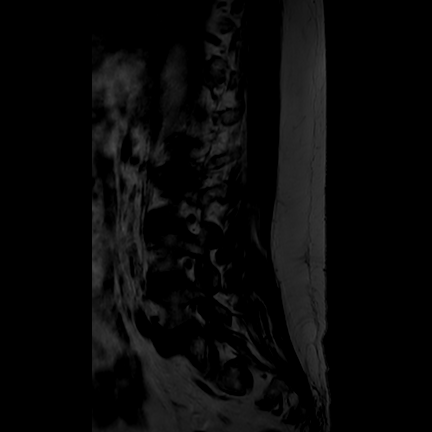
[im 18/18]
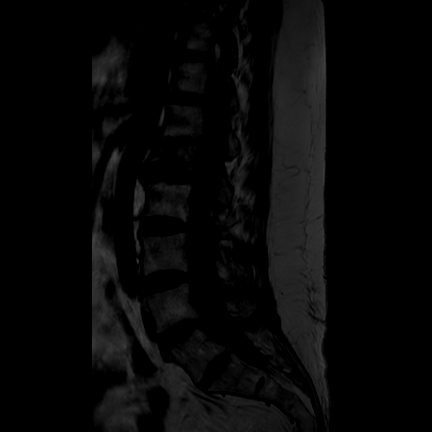
[im 18/18]
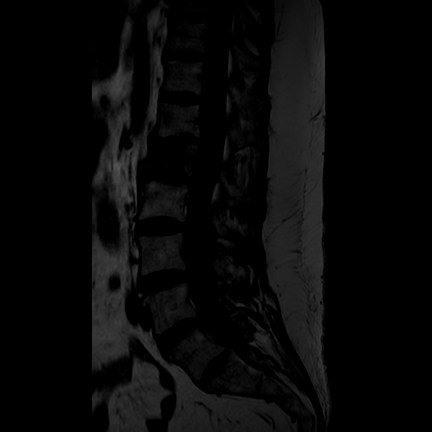
[im 18/18]
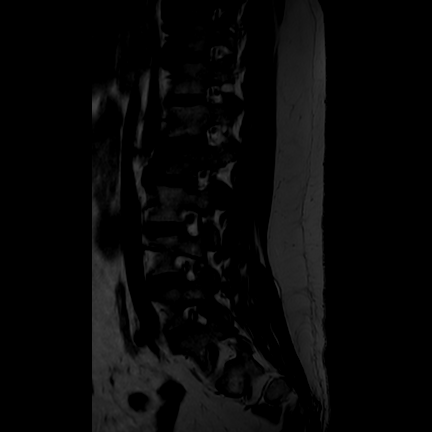

[Series 301: T2 · sagittal · 3.0mm · 0.62mm/px · 18 acquisitions, 9 frames shown (1 of 2)]
[im 1/18]
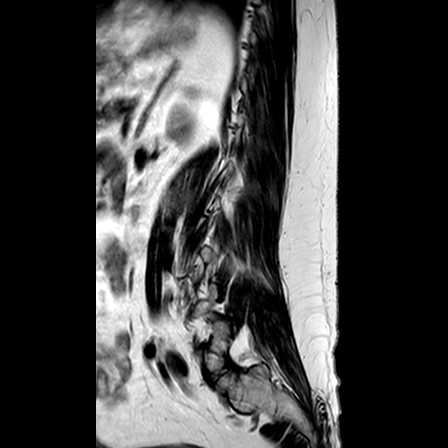
[im 5/18]
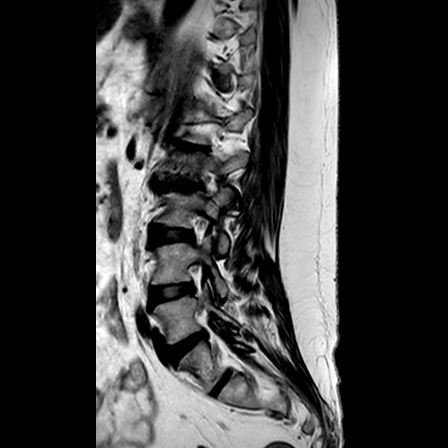
[im 10/18]
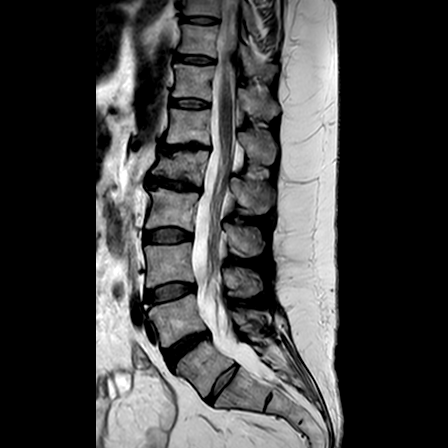
[im 15/18]
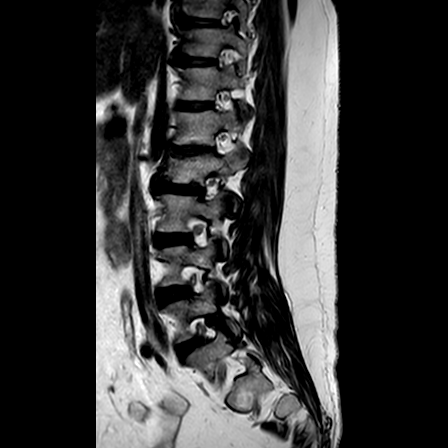
[im 18/18]
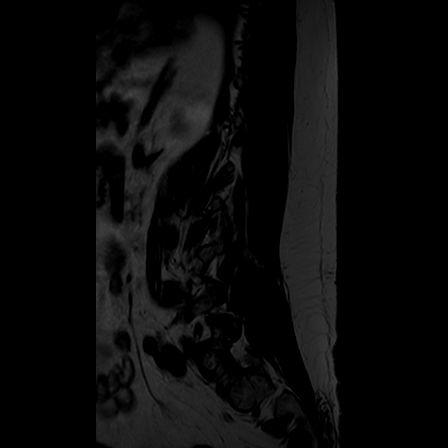
[im 18/18]
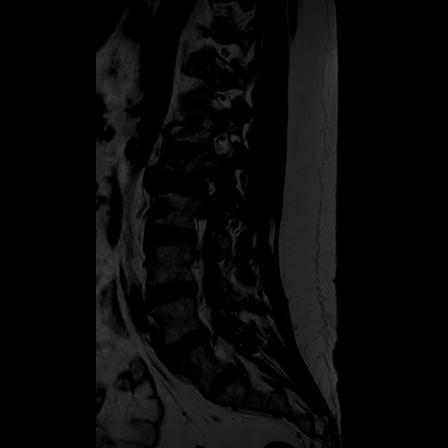
[im 18/18]
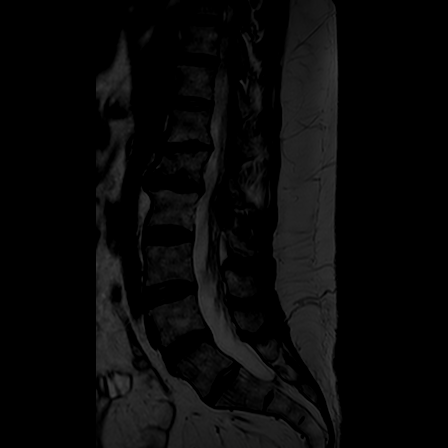
[im 18/18]
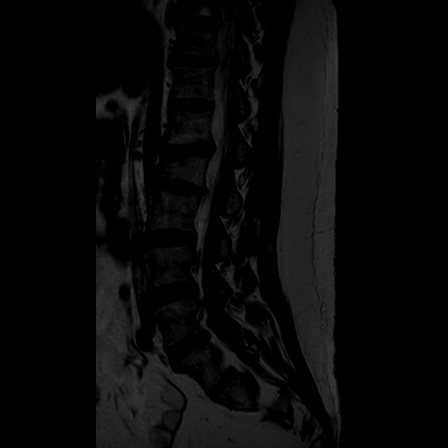
[im 18/18]
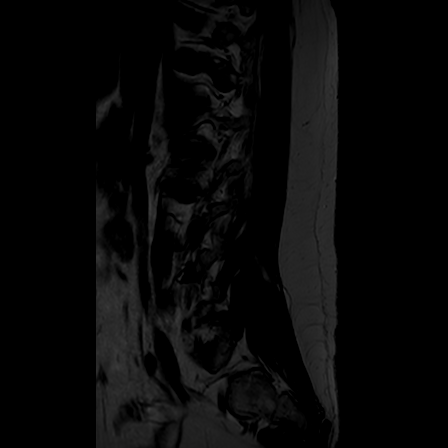

[Series 401: STIR · sagittal · 3.0mm · 0.73mm/px · 18 acquisitions, 3 frames shown]
[im 10/18]
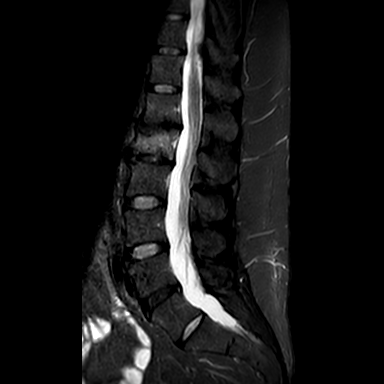
[im 18/18]
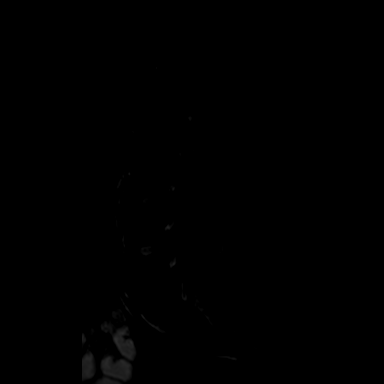
[im 18/18]
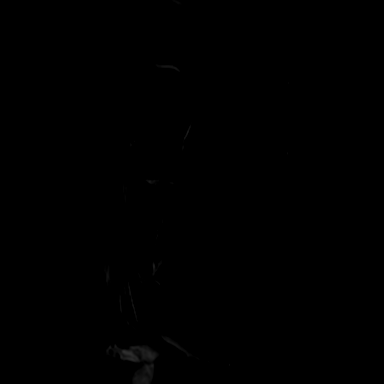

[Series 501: T2 · axial · 3.5mm · 0.57mm/px · z∈[-12,+168]mm · 46 acquisitions, 9 frames shown (2 of 2)]
[im 3/46]
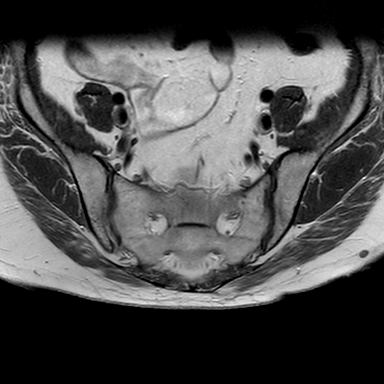
[im 8/46]
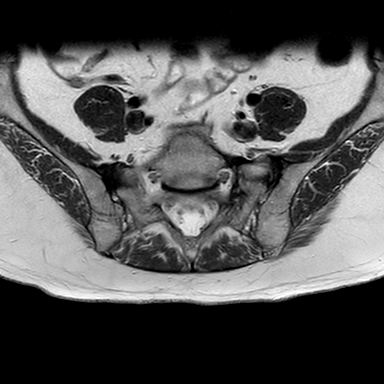
[im 12/46]
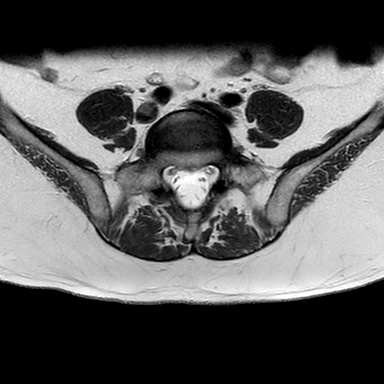
[im 17/46]
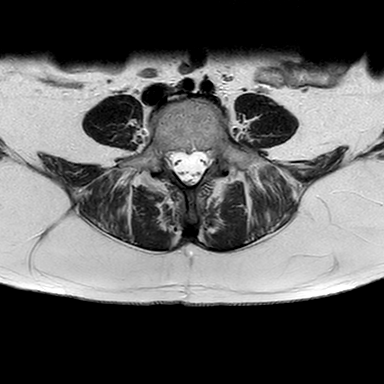
[im 27/46]
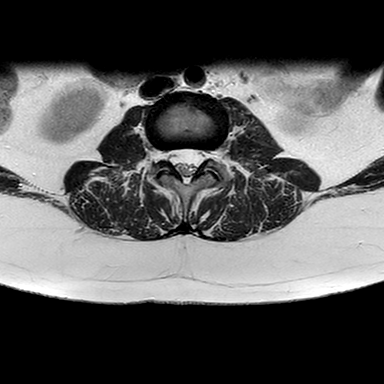
[im 34/46]
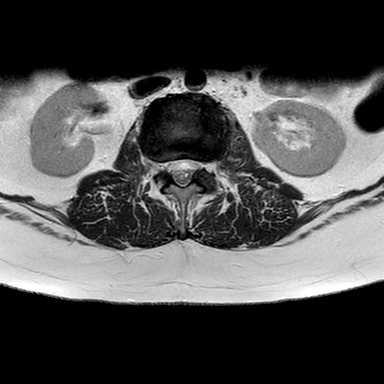
[im 43/46]
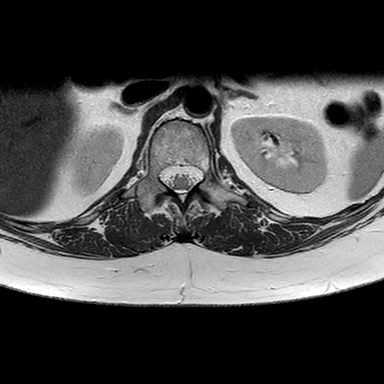
[im 46/46]
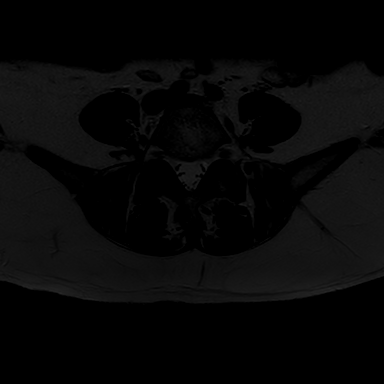
[im 46/46]
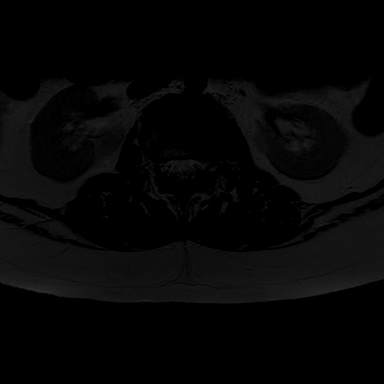

[29 of 48 positions shown; findings below may reference images not displayed]

FINDINGS: There is transitional lumbar anatomy with probable sacralization of the L5 vertebral body. For the
purposes of this exam last well-formed disc space designated L5-S1.
A minimal leftward convex curvature seen centered at L3-4. Trace retrolisthesis seen at L4-5.
Otherwise the static alignment is anatomic.
Compression deformity is seen involving the L1 vertebral body likely representing subacute/chronic
process. The remaining vertebral body heights are well-maintained.
Degenerative changes are seen with disc height loss and mixed low T2 signal most prominent at
T12-L1, L1-2 and L4-5.
The visualized spinal cord, conus medullaris and cauda equina demonstrate a normal intrinsic signal
morphology. Nose made of a fifth ventricle. The conus terminates at approximately T12-L1.
Small cyst is seen in the right kidney.
T12-L1: Bulging is facet arthropathy is identified resulting in minimal bilateral foraminal
narrowing
L1-2:: Bulging annulus and facet arthropathy is identified resulting in moderate bilateral foraminal
stenosis
L2-3: No significant stenosis
L3-4: Bulging annulus and facet arthropathy is identified resulting in minimal bilateral foraminal
narrowing
L4-5: Bulging annulus with facet arthropathy is identified resulting in moderate bilateral foraminal
stenosis
L5-S1: Bulging annulus and facet arthropathy is identified without significant stenosis
IMPRESSION: .
1. Subacute/chronic fracture involving the L1 vertebral body.
2. Degenerative changes as detailed above.
3. No obvious acute bony abnormality.

## 2022-03-22 IMAGING — CT CT abdomen/pelvis w/ IV con
2 of 6 series · 14 of 46 positions shown, 19 images · IV contrast (CONT)
Comparison: Lumbar spine MRI 04/06/2021.

ABDPELIV
REASON FOR EXAM: Pain
TECHNIQUE: Multiplanar postcontrast CT abdomen pelvis with 100 mL Omnipaque 300 IV contrast.

[Series 3: soft tissue w/ · axial · 0.73mm/px · z∈[-428,-32]mm · 11 of 184 slices shown, 16 images]
[im 13/184  soft-tissue]
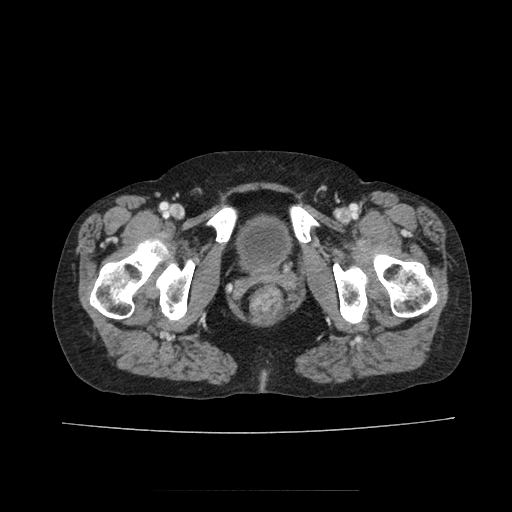
[im 13/184  bone]
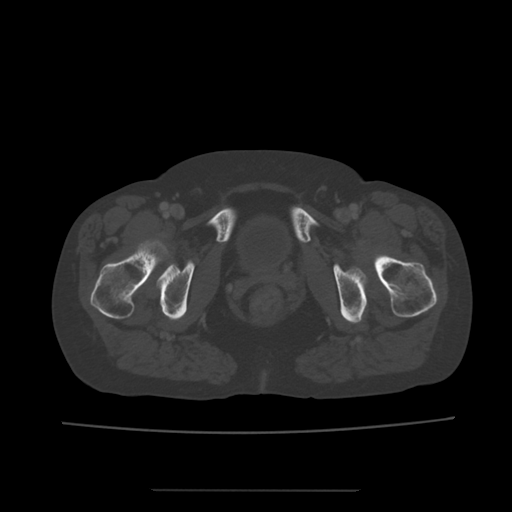
[im 37/184  soft-tissue]
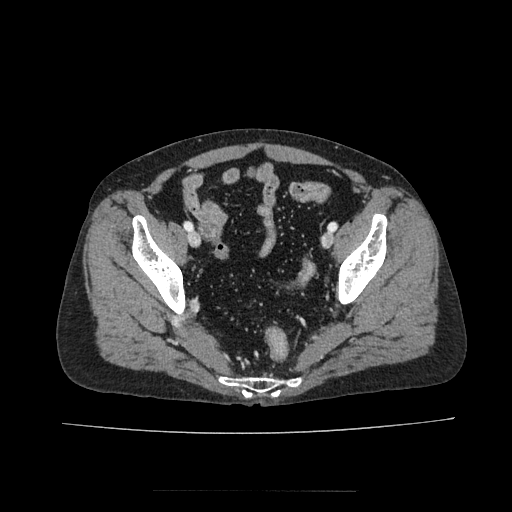
[im 49/184  soft-tissue]
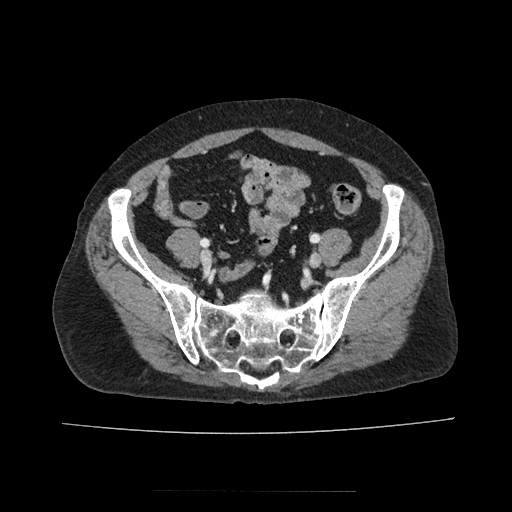
[im 62/184  soft-tissue]
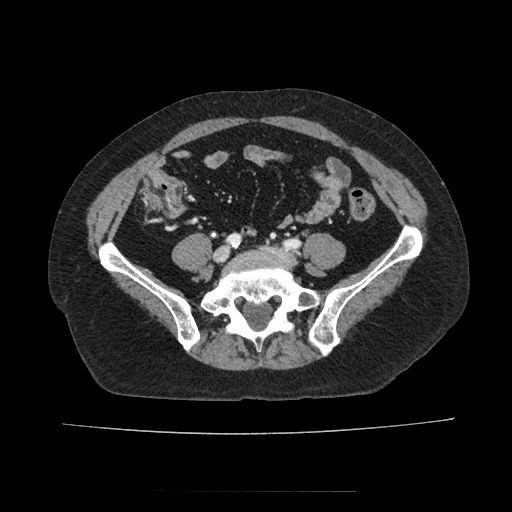
[im 86/184  soft-tissue]
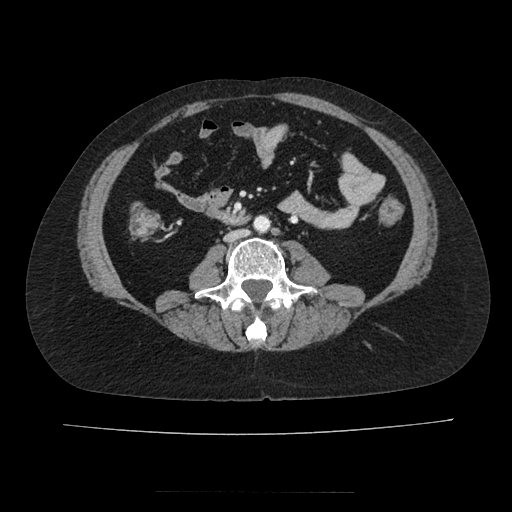
[im 98/184  soft-tissue]
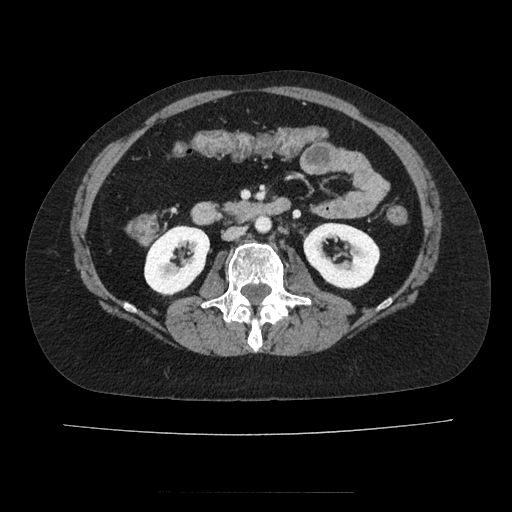
[im 123/184  soft-tissue]
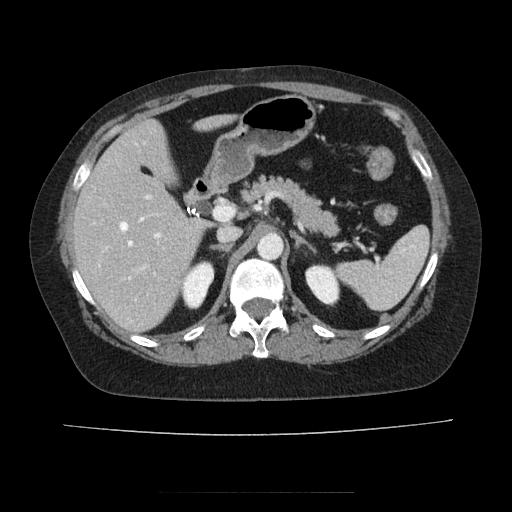
[im 135/184  soft-tissue]
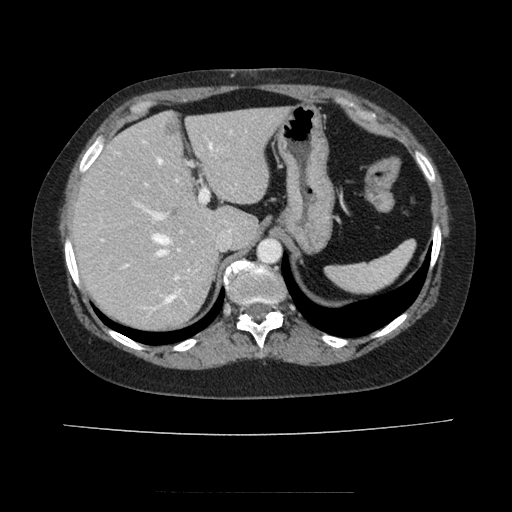
[im 135/184  lung]
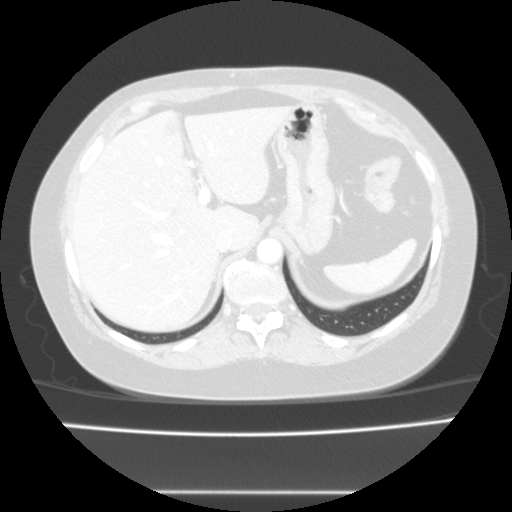
[im 147/184  soft-tissue]
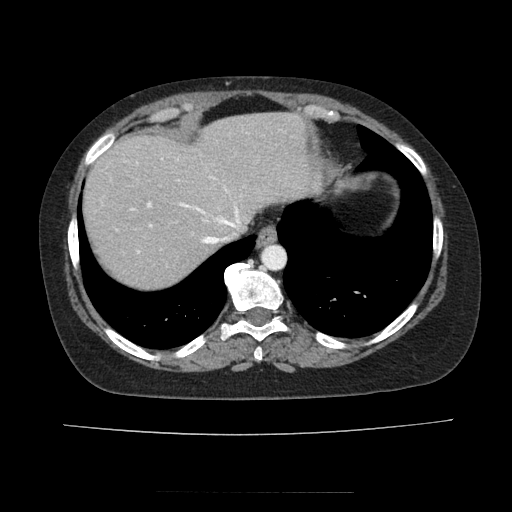
[im 147/184  lung]
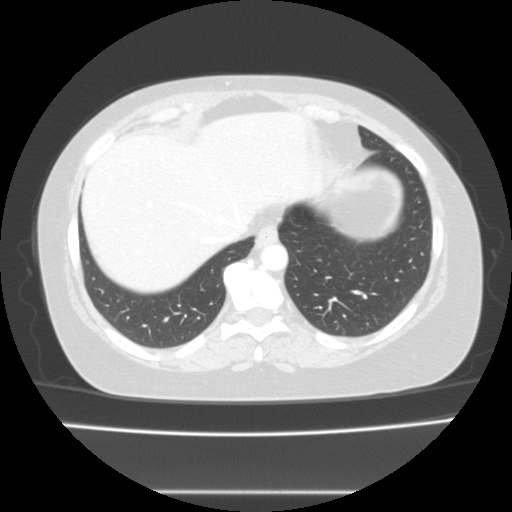
[im 147/184  bone]
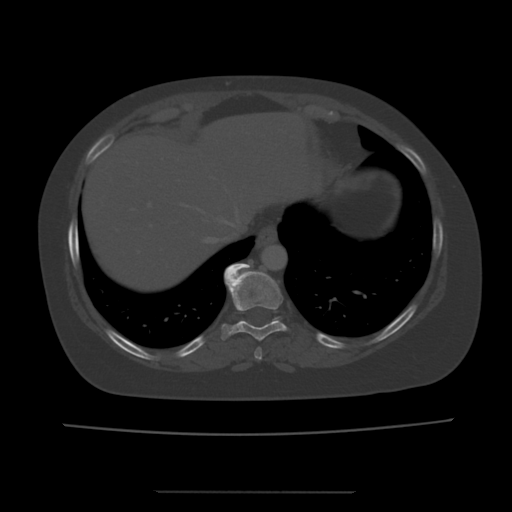
[im 159/184  lung]
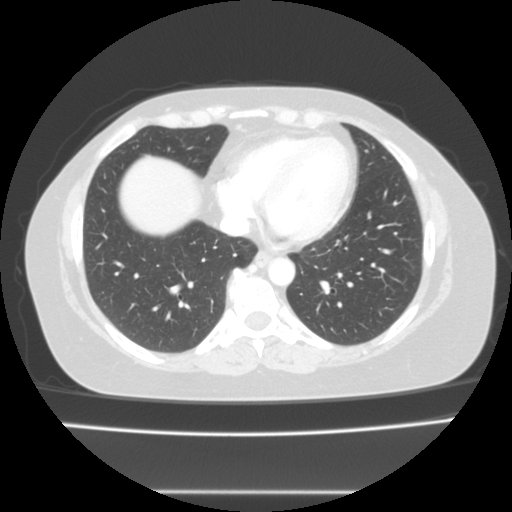
[im 171/184  soft-tissue]
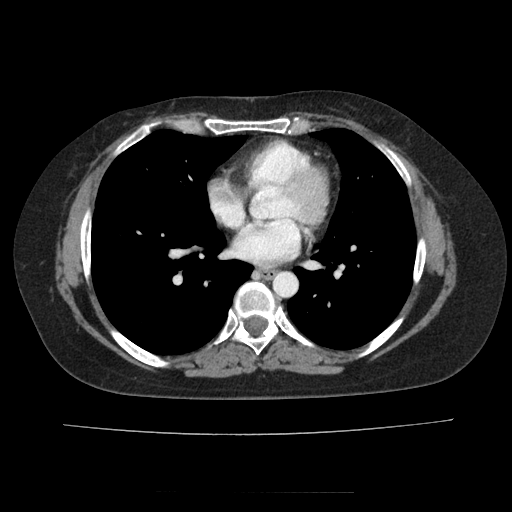
[im 171/184  lung]
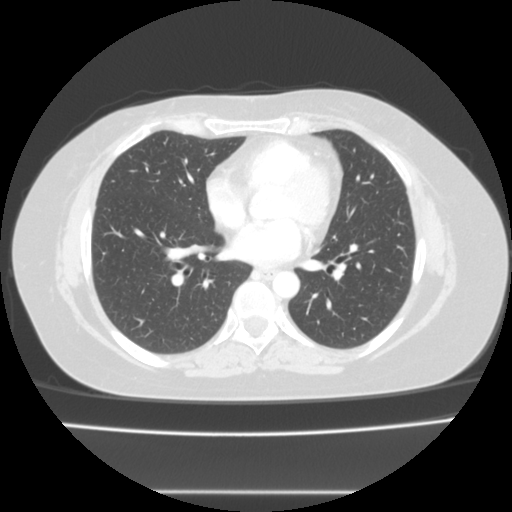

[Series 1001: coronal w/ · coronal · 0.90mm/px · 3 of 83 slices shown]
[im 21/83  soft-tissue]
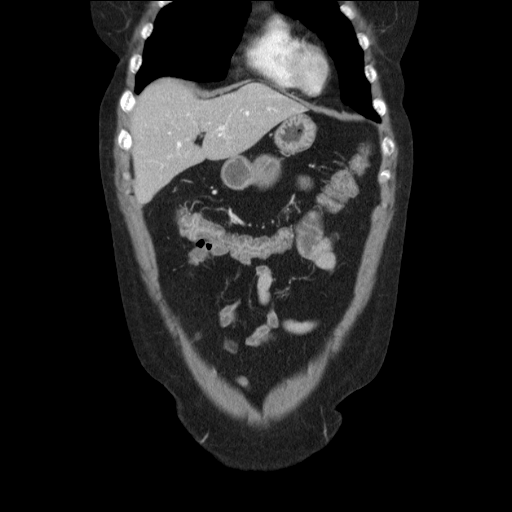
[im 42/83  soft-tissue]
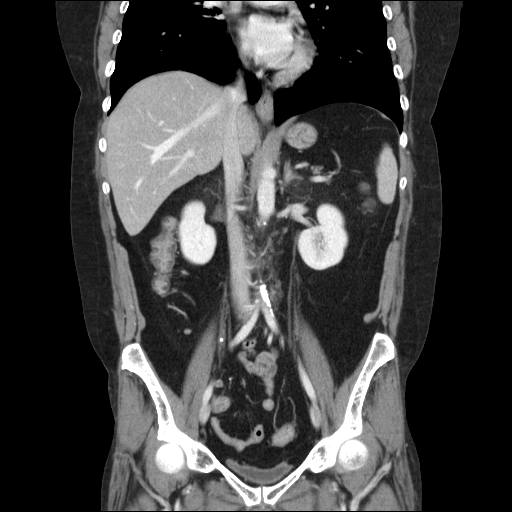
[im 62/83  soft-tissue]
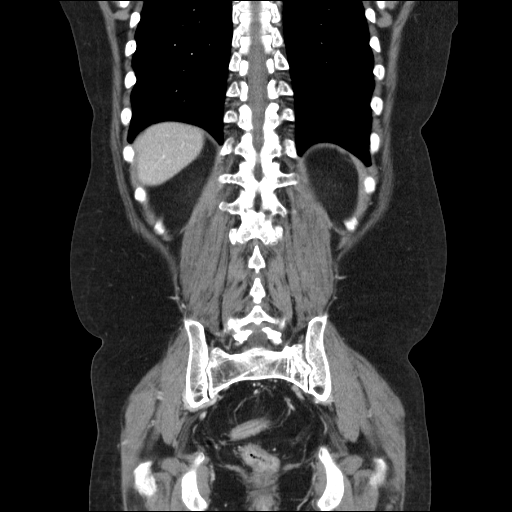

[14 of 46 positions shown; findings below may reference images not displayed]

FINDINGS: Visualized chest shows no effusion or consolidation.
LIVER:  Normal size and attenuation.  Portal vein is patent.
BILIARY TREE:  Biliary tree is decompressed.
PANCREAS:  No ductal dilatation.  No peripancreatic fluid collection or inflammation.
SPLEEN:  No splenomegaly.
KIDNEYS:  No hydronephrosis.  No nephrolithiasis.  No perinephric fluid collection. Simple cyst of
the left kidney measures 1.1 cm
ADRENALS:  Normal configuration.
GI TRACT/ MESENTERY:  No obstruction.  No free air. Normal appendix.
BONES/ SOFT TISSUES:  No acute abnormality. Old diskitis osteomyelitis changes at L2.
VASCULATURE:  Aorta is non-aneurysmal.
LYMPH:  No adenopathy.
PELVIS/ BLADDER:  Bladder has a normal appearance.   No mass.
IMPRESSION: No acute abdominopelvic process or adenopathy.
Normal appendix.
No free air or free fluid.
No gastrointestinal, genitourinary or hepatobiliary obstruction.

## 2024-05-28 ENCOUNTER — Encounter: Admit: 2024-05-28 | Discharge: 2024-05-28 | Payer: BLUE CROSS/BLUE SHIELD

## 2024-06-05 ENCOUNTER — Encounter: Admit: 2024-06-05 | Discharge: 2024-06-05 | Payer: BLUE CROSS/BLUE SHIELD
